# Patient Record
Sex: Male | Born: 1965 | Race: White | Hispanic: No | Smoking: Never smoker
Health system: Southern US, Community
[De-identification: ages and names within clinical notes are randomized; demographics above are authoritative.]

---

## 2003-09-10 ENCOUNTER — Inpatient Hospital Stay (HOSPITAL_COMMUNITY): Admission: RE | Admit: 2003-09-10 | Discharge: 2003-09-13 | Payer: Self-pay | Admitting: Psychiatry

## 2003-09-16 ENCOUNTER — Other Ambulatory Visit (HOSPITAL_COMMUNITY): Admission: RE | Admit: 2003-09-16 | Discharge: 2003-10-02 | Payer: Self-pay | Admitting: Psychiatry

## 2003-12-03 ENCOUNTER — Inpatient Hospital Stay (HOSPITAL_COMMUNITY): Admission: EM | Admit: 2003-12-03 | Discharge: 2003-12-05 | Payer: Self-pay | Admitting: Emergency Medicine

## 2004-06-05 ENCOUNTER — Ambulatory Visit (HOSPITAL_COMMUNITY): Admission: RE | Admit: 2004-06-05 | Discharge: 2004-06-06 | Payer: Self-pay | Admitting: Cardiology

## 2004-08-05 ENCOUNTER — Inpatient Hospital Stay (HOSPITAL_COMMUNITY): Admission: RE | Admit: 2004-08-05 | Discharge: 2004-08-09 | Payer: Self-pay | Admitting: Psychiatry

## 2004-08-05 ENCOUNTER — Ambulatory Visit: Payer: Self-pay | Admitting: Psychiatry

## 2004-08-05 ENCOUNTER — Emergency Department (HOSPITAL_COMMUNITY): Admission: EM | Admit: 2004-08-05 | Discharge: 2004-08-05 | Payer: Self-pay | Admitting: Emergency Medicine

## 2005-02-15 ENCOUNTER — Inpatient Hospital Stay (HOSPITAL_COMMUNITY): Admission: RE | Admit: 2005-02-15 | Discharge: 2005-02-17 | Payer: Self-pay | Admitting: Psychiatry

## 2005-02-15 ENCOUNTER — Emergency Department (HOSPITAL_COMMUNITY): Admission: EM | Admit: 2005-02-15 | Discharge: 2005-02-15 | Payer: Self-pay | Admitting: Emergency Medicine

## 2005-02-16 ENCOUNTER — Ambulatory Visit: Payer: Self-pay | Admitting: Psychiatry

## 2005-07-05 ENCOUNTER — Emergency Department (HOSPITAL_COMMUNITY): Admission: EM | Admit: 2005-07-05 | Discharge: 2005-07-06 | Payer: Self-pay | Admitting: Emergency Medicine

## 2005-07-06 ENCOUNTER — Inpatient Hospital Stay (HOSPITAL_COMMUNITY): Admission: EM | Admit: 2005-07-06 | Discharge: 2005-07-11 | Payer: Self-pay | Admitting: Psychiatry

## 2005-07-06 ENCOUNTER — Ambulatory Visit: Payer: Self-pay | Admitting: Psychiatry

## 2006-01-23 ENCOUNTER — Ambulatory Visit: Payer: Self-pay | Admitting: Psychiatry

## 2006-01-24 ENCOUNTER — Inpatient Hospital Stay (HOSPITAL_COMMUNITY): Admission: AD | Admit: 2006-01-24 | Discharge: 2006-01-26 | Payer: Self-pay | Admitting: Psychiatry

## 2007-04-09 ENCOUNTER — Other Ambulatory Visit: Payer: Self-pay

## 2007-04-09 ENCOUNTER — Inpatient Hospital Stay (HOSPITAL_COMMUNITY): Admission: AD | Admit: 2007-04-09 | Discharge: 2007-04-13 | Payer: Self-pay | Admitting: Psychiatry

## 2007-04-09 ENCOUNTER — Ambulatory Visit: Payer: Self-pay | Admitting: Psychiatry

## 2007-06-04 ENCOUNTER — Inpatient Hospital Stay (HOSPITAL_COMMUNITY): Admission: AD | Admit: 2007-06-04 | Discharge: 2007-06-06 | Payer: Self-pay | Admitting: *Deleted

## 2007-06-05 ENCOUNTER — Ambulatory Visit: Payer: Self-pay | Admitting: *Deleted

## 2007-06-28 ENCOUNTER — Emergency Department (HOSPITAL_COMMUNITY): Admission: EM | Admit: 2007-06-28 | Discharge: 2007-06-29 | Payer: Self-pay | Admitting: Emergency Medicine

## 2007-06-29 ENCOUNTER — Inpatient Hospital Stay (HOSPITAL_COMMUNITY): Admission: AD | Admit: 2007-06-29 | Discharge: 2007-07-05 | Payer: Self-pay | Admitting: *Deleted

## 2007-06-29 ENCOUNTER — Ambulatory Visit: Payer: Self-pay | Admitting: *Deleted

## 2007-09-11 ENCOUNTER — Emergency Department (HOSPITAL_COMMUNITY): Admission: EM | Admit: 2007-09-11 | Discharge: 2007-09-11 | Payer: Self-pay | Admitting: Emergency Medicine

## 2008-03-20 ENCOUNTER — Emergency Department (HOSPITAL_COMMUNITY): Admission: EM | Admit: 2008-03-20 | Discharge: 2008-03-20 | Payer: Self-pay | Admitting: Emergency Medicine

## 2008-06-19 ENCOUNTER — Emergency Department (HOSPITAL_COMMUNITY): Admission: EM | Admit: 2008-06-19 | Discharge: 2008-06-20 | Payer: Self-pay | Admitting: Emergency Medicine

## 2009-03-30 ENCOUNTER — Emergency Department (HOSPITAL_COMMUNITY): Admission: EM | Admit: 2009-03-30 | Discharge: 2009-03-31 | Payer: Self-pay | Admitting: Emergency Medicine

## 2009-03-30 ENCOUNTER — Ambulatory Visit: Payer: Self-pay | Admitting: Psychiatry

## 2009-03-31 ENCOUNTER — Inpatient Hospital Stay (HOSPITAL_COMMUNITY): Admission: RE | Admit: 2009-03-31 | Discharge: 2009-04-04 | Payer: Self-pay | Admitting: Psychiatry

## 2009-04-08 ENCOUNTER — Emergency Department (HOSPITAL_COMMUNITY): Admission: EM | Admit: 2009-04-08 | Discharge: 2009-04-09 | Payer: Self-pay | Admitting: Emergency Medicine

## 2009-04-13 ENCOUNTER — Emergency Department (HOSPITAL_COMMUNITY): Admission: EM | Admit: 2009-04-13 | Discharge: 2009-04-13 | Payer: Self-pay | Admitting: Family Medicine

## 2010-05-24 LAB — COMPREHENSIVE METABOLIC PANEL
ALT: 32 U/L (ref 0–53)
AST: 39 U/L — ABNORMAL HIGH (ref 0–37)
CO2: 26 mEq/L (ref 19–32)
Calcium: 8.8 mg/dL (ref 8.4–10.5)
Chloride: 104 mEq/L (ref 96–112)
Creatinine, Ser: 0.63 mg/dL (ref 0.4–1.5)
GFR calc Af Amer: >60 mL/min (ref >60)
GFR calc non Af Amer: >60 mL/min (ref >60)
Glucose, Bld: 102 mg/dL — ABNORMAL HIGH (ref 70–99)
Sodium: 140 mEq/L (ref 135–145)
Total Bilirubin: 0.5 mg/dL (ref 0.3–1.2)

## 2010-05-24 LAB — RAPID URINE DRUG SCREEN, HOSP PERFORMED
Amphetamines: NOT DETECTED
Benzodiazepines: NOT DETECTED
Cocaine: POSITIVE — AB
Tetrahydrocannabinol: POSITIVE — AB

## 2010-05-24 LAB — URINALYSIS, ROUTINE W REFLEX MICROSCOPIC
Bilirubin Urine: NEGATIVE
Hgb urine dipstick: NEGATIVE
Ketones, ur: NEGATIVE mg/dL
Nitrite: NEGATIVE
Protein, ur: 30 mg/dL — AB
Urobilinogen, UA: 0.2 mg/dL (ref 0.0–1.0)

## 2010-05-24 LAB — RPR: RPR Ser Ql: NONREACTIVE

## 2010-05-24 LAB — PROTIME-INR
INR: 1 (ref 0.00–1.49)
Prothrombin Time: 13.1 seconds (ref 11.6–15.2)

## 2010-05-24 LAB — URINE MICROSCOPIC-ADD ON

## 2010-05-24 LAB — DIFFERENTIAL
Basophils Absolute: 0 10*3/uL (ref 0.0–0.1)
Basophils Relative: 0 % (ref 0–1)
Eosinophils Absolute: 0.1 10*3/uL (ref 0.0–0.7)
Eosinophils Relative: 1 % (ref 0–5)
Neutrophils Relative %: 49 % (ref 43–77)

## 2010-05-24 LAB — CBC
Hemoglobin: 14.5 g/dL (ref 13.0–17.0)
MCHC: 33.5 g/dL (ref 30.0–36.0)
MCV: 88.1 fL (ref 78.0–100.0)
RBC: 4.92 MIL/uL (ref 4.22–5.81)
WBC: 4.6 10*3/uL (ref 4.0–10.5)

## 2010-05-27 LAB — DIFFERENTIAL
Basophils Relative: 2 % — ABNORMAL HIGH (ref 0–1)
Eosinophils Absolute: 0.1 10*3/uL (ref 0.0–0.7)
Eosinophils Relative: 1 % (ref 0–5)
Monocytes Absolute: 0.9 10*3/uL (ref 0.1–1.0)
Monocytes Relative: 7 % (ref 3–12)

## 2010-05-27 LAB — POCT I-STAT, CHEM 8
Calcium, Ion: 1.07 mmol/L — ABNORMAL LOW (ref 1.12–1.32)
Glucose, Bld: 95 mg/dL (ref 70–99)
HCT: 45 % (ref 39.0–52.0)
Hemoglobin: 15.3 g/dL (ref 13.0–17.0)
TCO2: 27 mmol/L (ref 0–100)

## 2010-05-27 LAB — CBC
HCT: 43.2 % (ref 39.0–52.0)
Platelets: 192 10*3/uL (ref 150–400)
RDW: 14.4 % (ref 11.5–15.5)

## 2010-05-27 LAB — ETHANOL: Alcohol, Ethyl (B): 303 mg/dL — ABNORMAL HIGH (ref 0–10)

## 2010-06-17 LAB — DIFFERENTIAL
Basophils Absolute: 0.1 10*3/uL (ref 0.0–0.1)
Basophils Relative: 1 % (ref 0–1)
Eosinophils Absolute: 0.3 10*3/uL (ref 0.0–0.7)
Eosinophils Relative: 3 % (ref 0–5)
Monocytes Absolute: 0.9 10*3/uL (ref 0.1–1.0)
Neutro Abs: 3.6 10*3/uL (ref 1.7–7.7)

## 2010-06-17 LAB — CBC
HCT: 45.1 % (ref 39.0–52.0)
Platelets: 213 10*3/uL (ref 150–400)
WBC: 8.4 10*3/uL (ref 4.0–10.5)

## 2010-06-17 LAB — COMPREHENSIVE METABOLIC PANEL
AST: 63 U/L — ABNORMAL HIGH (ref 0–37)
Albumin: 4.4 g/dL (ref 3.5–5.2)
Alkaline Phosphatase: 88 U/L (ref 39–117)
BUN: 6 mg/dL (ref 6–23)
Chloride: 109 mEq/L (ref 96–112)
Potassium: 4.4 mEq/L (ref 3.5–5.1)
Total Bilirubin: 0.2 mg/dL — ABNORMAL LOW (ref 0.3–1.2)

## 2010-06-17 LAB — LIPASE, BLOOD: Lipase: 32 U/L (ref 11–59)

## 2010-06-17 LAB — ETHANOL
Alcohol, Ethyl (B): 267 mg/dL — ABNORMAL HIGH (ref 0–10)
Alcohol, Ethyl (B): 437 mg/dL (ref 0–10)

## 2010-06-17 LAB — RAPID URINE DRUG SCREEN, HOSP PERFORMED: Benzodiazepines: NOT DETECTED

## 2010-06-22 LAB — LIPASE, BLOOD: Lipase: 22 U/L (ref 11–59)

## 2010-06-22 LAB — COMPREHENSIVE METABOLIC PANEL
ALT: 27 U/L (ref 0–53)
AST: 28 U/L (ref 0–37)
Albumin: 3.7 g/dL (ref 3.5–5.2)
CO2: 27 mEq/L (ref 19–32)
Calcium: 9.2 mg/dL (ref 8.4–10.5)
Chloride: 104 mEq/L (ref 96–112)
GFR calc Af Amer: >60 mL/min (ref >60)
GFR calc non Af Amer: >60 mL/min (ref >60)
Sodium: 138 mEq/L (ref 135–145)
Total Bilirubin: 1.4 mg/dL — ABNORMAL HIGH (ref 0.3–1.2)

## 2010-06-22 LAB — URINALYSIS, ROUTINE W REFLEX MICROSCOPIC
Nitrite: NEGATIVE
Specific Gravity, Urine: 1.022 (ref 1.005–1.030)
Urobilinogen, UA: 1 mg/dL (ref 0.0–1.0)

## 2010-06-22 LAB — DIFFERENTIAL
Eosinophils Absolute: 0.2 10*3/uL (ref 0.0–0.7)
Eosinophils Relative: 3 % (ref 0–5)
Lymphs Abs: 1.7 10*3/uL (ref 0.7–4.0)
Monocytes Absolute: 0.5 10*3/uL (ref 0.1–1.0)

## 2010-06-22 LAB — CBC
Platelets: 167 10*3/uL (ref 150–400)
RBC: 4.59 MIL/uL (ref 4.22–5.81)
WBC: 7 10*3/uL (ref 4.0–10.5)
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
## 2010-07-21 NOTE — H&P (Signed)
Maxwell Reeves, GREENLEY NO.:  [id number]   MEDICAL RECORD NO.:  [id number]          PATIENT TYPE:  IPS   LOCATION:  0506                          FACILITY:  [facility]   PHYSICIAN:  Anselm Jungling, MD  DATE OF BIRTH:  30-Oct-1965   DATE OF ADMISSION:  04/09/2007  DATE OF DISCHARGE:                       PSYCHIATRIC ADMISSION ASSESSMENT   This is a 45 year old male voluntarily admitted on April 09, 2007.   HISTORY OF PRESENT ILLNESS:  The patient presents with a history of  alcohol and drug abuse.  He was here approximately 1 year ago.  He has  been drinking about a fifth a day.  Also using cocaine, crack and  possibly marijuana.  He denies any suicidal thoughts.  He does feel  guilty about his relapsing.  The patient states that his stressors are  that he lost his job in December which contributed to the relapse.   PAST PSYCHIATRIC HISTORY:  The patient was here in November 2007 for  alcohol and an overdose on Klonopin.   SOCIAL HISTORY:  He is a 45 year old male, has a 87 year old child.  The  patient lives with his mother and his son also lives with his mother.  The patient is currently unemployed.   FAMILY HISTORY:  Is none known.   ALCOHOL DRUG HISTORY:  As above.  Denies any seizure activity.   MEDICAL HISTORY:  Primary care Merryn Thaker is Dr. Johnnye Lana in [place], [place].  Medical problems.  The patient has had two cardiac stents and  a history of hypertension.   MEDICATIONS:  Klonopin 1 mg t.i.d., Diovan 160/25 daily and trazodone  300 at bedtime.  Also recently received a prescription for Neurontin.  He states that was to be used to help him  taper off the Klonopin and  that was Neurontin 300 mg t.i.d.  Also an aspirin 325 mg daily.   DRUG ALLERGIES:  CODEINE and he states most ANTIDEPRESSANTS.   PHYSICAL EXAM:  The patient was assessed at [facility].  He  received Ultram 50 mg.  His temperature is 98.5, 88 heart rate, 20  respirations, blood pressure is 134/78.  He is 260 pounds.  He is 6 feet  4 and 1/2 inches tall.  He has noted tattoos to his shoulders.  He  appears very muscular and in no distress.  No tremors were noted.   LABORATORY DATA:  Alcohol level 243.  Urine drug screen is positive for  benzodiazepines and positive for cocaine.  His glucose is 112.   MENTAL STATUS EXAM:  He is alert and oriented, cooperative, currently in  the hospital gown, with good eye contact. His speech is clear, normal  pace and tone.  The patient's mood is guilty and anxious.  The patient  became tearful when talking about his son.  Thought processes are  coherent, goal directed. There is no evidence of any delusional thinking  or psychotic symptoms, cognitive function intact.  His memory is good.  Judgment and insight is fair.  Poor impulse control.   AXIS I:  Polysubstance abuse/dependence.  Substance-induced mood  disorder.  AXIS II:  Deferred.  AXIS III:  Coronary artery disease and hypertension.  AXIS IV:  Problems related to occupation, medical problems, possible  problems with economic issues and other psychosocial problems with  chronic substance use.  AXIS V:  Current is 40-45.   PLAN:  Contract for safety.  We will stabilize his mood and thinking.  Will detox the patient with Librium protocol, work on relapse  prevention.  The patient was notified we will discontinue his Klonopin.  The patient is agreeable to such.  We will continue to assess  comorbidities.  He is to attend the red group, and we will continue with  his other medications.  The patient to follow up with his primary care  Daisa Stennis as advised.   His tentative length of stay is 4-6 days.      Landry Corporal, N.P.      Anselm Jungling, MD  Electronically Signed    JO/MEDQ  D:  04/10/2007  T:  04/10/2007  Job:  [id number]

## 2010-07-24 NOTE — Cardiovascular Report (Signed)
NAMEWAYLAND, Maxwell Reeves NO.:  [id number]   MEDICAL RECORD NO.:  [id number]          PATIENT TYPE:  OIB   LOCATION:  6527                         FACILITY:  [facility]   PHYSICIAN:  Colleen Can. Deborah Chalk, M.D.DATE OF BIRTH:  [birth year]   DATE OF PROCEDURE:  06/05/2004  DATE OF DISCHARGE:  06/06/2004                              CARDIAC CATHETERIZATION   PROCEDURE:  Left heart catheterization with selective coronary  arteriography, left ventricular angiography, and PCI of the intermediate  restenosis within the Cypher stent.   TYPE AND SIGHT OF INTERVENTION:  Right femoral artery.   CATHETERS:  A 6 [place] _4 curve_________Judkins right and left coronary  catheters, a 6 French pigtail ventricular guiding catheter, 6 [place] JL4  guide, Prowater guide wire, 2.5 x 15 mm Quantum balloon, 3 x 15 mm cutting  balloon.   CONTRAST MATERIAL:  Omnipaque.   MEDICATIONS GIVEN PRIOR TO THE PROCEDURE:  Valium 10 mg p.o.   MEDICATIONS DURING THE PROCEDURE:  1.  Versed 3 mg IV.  2.  Angiomax.  3.  Intracoronary nitroglycerin.   COMMENTS:  The patient tolerated the procedure well.   HEMODYNAMIC DATA:  The aortic pressure was 107/62, LV was 108/1-11.  There  was no aortic valve gradient noted on pullback.   ANGIOGRAPHIC DATA:  1.  The right coronary artery had mild irregularities, it was a large      dominant vessel.  2.  Left main coronary artery is normal.  3.  Left anterior descending had irregularities without significant focal      stenosis.  4.  Left circumflex had irregularities without significant stenosis.  5.  The intermediate coronary artery had a severe stenosis within the Cypher      stent.  There was distal irregularities from the stent.  The side branch      which was at least a 2.5 mm vessel that arose within the stented segment      was widely patent and had satisfactory flow.  There was moderate (50 to      60%) narrowing prior to the stent.  There was residual  distal stenosis      present.   The left ventricular angiogram was performed in the RAO position.  Overall  coronary size was normal, global ejection fraction was 60%.   Angioplasty procedure we used a MV7 guide with Prowater guide wire.  This  passed across the lesion very efficiently.  We initially tried to cross with  the cutting balloon directly, but the stenosis was too tight.  We then  returned with a 2.5 x 15 mm Quantum Maverick balloon.  The lesion was pre-  dilated.  We then returned with the 3 x 15 mm cutting balloon and had  successful angioplasty with numerous inflations within the stented segment.  This resulted in an excellent flow.  The side branch remained patent.  There  was a 50% narrowing in the vessel distal to the stent that was present  previously, and we elected not to dilate further distally.   The patient tolerated the procedure well.  OVERALL IMPRESSION:  1.  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  2.  Successful cutting balloon angioplasty.      SNT/MEDQ  D:  06/05/2004  T:  06/06/2004  Job:  [id number]
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
## 2010-07-24 NOTE — Cardiovascular Report (Signed)
NAMEJDYN, PARKERSON NO.:  [id number]   MEDICAL RECORD NO.:  [id number]          PATIENT TYPE:  INP   LOCATION:  6526                         FACILITY:  [facility]   PHYSICIAN:  Colleen Can. Deborah Chalk, M.D.DATE OF BIRTH:  10-29-1965   DATE OF PROCEDURE:  DATE OF DISCHARGE:  12/05/2003                              CARDIAC CATHETERIZATION   HISTORY:  Mr. Mori is a 45 year old gentleman who presents with substernal  chest pain and had a small rise in his cardiac troponins.  He was referred  for catheterization.   PROCEDURE:  Left heart catheterization with selective coronary angiography,  left ventricular angiography with percutaneous coronary intervention of a  complex lesion in a large bifurcating intermediate vessel, including the  point of bifurcation with stenting across the bifurcation with angioplasty  of the ostium of the other large branch.   TYPE AND SITE OF ENTRY:  Percutaneous right femoral artery.   CATHETERS:  1.  6 French __________ Judkins right and left coronary catheters.  2.  6 French pigtail ventriculography catheter.  3.  7 [place] JL4 guide.  4.  High-torque floppy guide wires x 2 (dual wires).  5.  2.5 x 15 Maverick balloon.  6.  A 3.0 x 23 mm CYPHER stent.   CONTRAST MATERIAL:  Omnipaque 275 mL.   MEDICATIONS GIVEN PRIOR TO PROCEDURE:  Valium 10 mg p.o.   MEDICATIONS GIVEN DURING PROCEDURE:  1.  Versed 3 mg IV.  2.  Integrillin.  3.  Heparin 6100 units.  4.  Plavix 600 mg.   COMMENTS:  The patient tolerated the procedure well.   HEMODYNAMIC DATA:  The aortic pressure was 119/79.  LV was 113/9-60 and  there was no aortic valve gradient noted on pullback.   ANGIOGRAPHIC DATA:  1.  The left main coronary artery is normal.  2.  The left circumflex is moderate size.  There were mild irregularities in      the body of the left circumflex.  3.  The left anterior descending is a large vessel.  There was 20-30%      narrowing after the first  septal perforating branch.  4.  The right coronary artery is a moderately large dominant vessel.  There      was 50-60% narrowing after the acute margin, but before the crux.      Distal vessels were satisfactory.  There were some scattered      irregularities proximally.  5.  The intermediate coronary was a large vessel that bifurcated.  One      branch of the bifurcation had a 95% stenosis that was approximately 10      mm in length.  It was just after the bifurcation.  The plaque seemingly      involved the ostium of the second branch.  Functionally it was a      bifurcation lesion.   PERCUTANEOUS INTERVENTION:  Using the 7 [place] system, we placed dual guide  wires into both branches of the intermediate.  We initially dilated the more  severely stenotic branch with 2.5  x 15 mm Maverick balloon.  We then  returned with the second Maverick balloon, not with kissing balloon  technique, but by an individual balloon that dilated the ostium of the other  branch.  We then placed the 23 mm x 3.0 mm CYPHER stent in the branch that  was more severely stenotic.  We then removed the guide wire from the less  severely stenotic branch.  The CYPHER stent was inflated to a maximum of 17  atmospheres.  The final angiographic result shows no residual stenosis in  the branch that received the stent.  In the other branch that arose through  the stent, there appeared to be 50% to perhaps 60% narrowing.  There  certainly was no compromise of flow in either branch and TIMI-3 grade flow  was present.  Overall the patient tolerated the procedure well.   The left ventricular angiogram was performed, which showed very minimal  anterolateral hypokinesis.  The global ejection fraction would be 55%.  There was no mitral regurgitation.   OVERALL IMPRESSION:  1.  Mild anterolateral hypokinesia with a global ejection fraction of 55%.  2.  Severe stenosis in a bifurcation lesion of a large intermediate vessel       with successful stent placement and balloon angioplasty of the ostium of      the second vessel.  3.  Mild atherosclerosis of the left anterior descending with moderate      atherosclerosis in the right coronary artery.       SNT/MEDQ  D:  12/04/2003  T:  12/05/2003  Job:  [id number]

## 2010-07-24 NOTE — Discharge Summary (Signed)
Maxwell Reeves, Maxwell Reeves NO.:  [id number]   MEDICAL RECORD NO.:  [id number]          PATIENT TYPE:  OIB   LOCATION:  6527                         FACILITY:  [facility]   PHYSICIAN:  Colleen Can. Deborah Chalk, M.D.DATE OF BIRTH:  [birth year]   DATE OF ADMISSION:  06/05/2004  DATE OF DISCHARGE:  06/06/2004                                 DISCHARGE SUMMARY   DATE OF DISCHARGE:  Tentatively set for June 06, 2004.   PRIMARY DISCHARGE DIAGNOSES:  Recurrent chest pain with subsequent elective  cardiac catheterization with percutaneous coronary intervention to  intermediate vessel secondary to re-stenosis within the previously placed  Cypher stent.   SECONDARY DISCHARGE DIAGNOSES:  1.  Known history of atherosclerotic cardiovascular disease with previous      percutaneous coronary intervention performed in September of 2005.  2.  Hypertension.  3.  Anxiety and depression.  4.  Hyperlipidemia.  5.  Ongoing tobacco abuse.   HISTORY OF PRESENT ILLNESS:  The patient is a 45 year old white male who has  multiple medical problems.  He presented to the office as a work-in  appointment earlier this week with complaints of recurrent angina. His chest  pain syndrome consists of his arms getting quite heavy and lifeless then  with the onset of chest heaviness that has been exertional in nature.  He  has previously been off his Norvasc over the past few days and doubled up on  his Diovan for better blood pressure control.  He stopped his Plavix at  least six weeks ago after an episode of the flu.  Unfortunately he continues  to smoke.   Please see dictated history and physical for further patient presentation  and profile.   LABORATORY DATA:  A 12 lead electrocardiogram shows normal sinus rhythm.  There are no acute changes.  Pro Time and PTT were unremarkable.   Chemistries were satisfactory.  Cholesterol level showed total cholesterol  157, triglycerides 92, HDL 59, LDL 80.  CBC is  normal.   HOSPITAL COURSE:  The patient was admitted electively.  He underwent repeat  catheterization per Dr. Roger Shelter.  That procedure was tolerated well  without any known complications.  The left main was normal.  The right  coronary artery had irregularities as well as the left anterior descending  and left circumflex.  There was a large intermediate vessel that has a 99%  narrowing within the previously placed stent from September of 2005.  Subsequent cutting balloon angioplasty was performed with a 3.0 X 15 mm  cutting balloon with an excellent result obtained.  There was a residual 50%  narrowing after the stent that was not dilated.  Post procedure he was  transferred to 6500 and plans will be made for him to be discharged in the  morning if deemed stable on A.M. rounds.   CONDITION ON DISCHARGE:  Stable.   DISCHARGE MEDICATIONS:  He will resume all of his previous home medications.  We will need for him to continue Plavix 75 mg each day, aspirin daily and he  will resume his Lipitor and  Diovan as he was taking before.   ACTIVITY:  His activity is to be light over the next few days.   He is strongly encouraged to not smoke.   FOLLOW UP:  We will plan to see him back in the office next week.  He is  asked to call to schedule that appointment, certainly sooner if problems  arise.      LC/MEDQ  D:  06/05/2004  T:  06/06/2004  Job:  [id number]
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
## 2010-07-24 NOTE — H&P (Signed)
NAME:  Maxwell Reeves, Maxwell Reeves NO.:  [id number]   MEDICAL RECORD NO.:  [id number]          PATIENT TYPE:  IPS   LOCATION:  0304                          [facility]  [facility]   PHYSICIAN:  Geoffery Lyons, M.D.      DATE OF BIRTH:  [birth year]   DATE OF ADMISSION:  08/05/2004  DATE OF DISCHARGE:                         PSYCHIATRIC ADMISSION ASSESSMENT   IDENTIFYING INFORMATION:  The patient is a 45 year old divorced white male  who was voluntarily admitted to the [facility] on Aug 05, 2004.   HISTORY OF PRESENT ILLNESS:  The patient presented to [facility] Emergency  Department requesting detox after a three-day blackout from drinking liquor,  smoking marijuana and cocaine use.  The patient admits to being a periodic  drinker who has detoxed one year ago here at the [facility]  and subsequently attended the [facility] program in the outpatient department.  He began drinking after 40 days of sobriety.  He also reports that he wants  to get off of Klonopin for which he has been taking for several years.  He  says that his drinking increases with stress and recent stressors of having  to increase his child support payments have increased his drinking.  He  denies any suicidal or homicidal ideation.  He denies any auditory or visual  hallucinations or any mania.   PAST PSYCHIATRIC HISTORY:  He was inpatient at the [facility]  in July of 2005.  He was outpatient at [facility] at the [facility] later that same month.   SOCIAL HISTORY:  He lives part-time with his girlfriend, part-time with his  mother.  He works for a [employer].  He has two sons, 29 and 11 years  of age, who he will have all this summer.   FAMILY HISTORY:  His father and his paternal grandfather were both  alcoholic.  His sister has depression.   ALCOHOL/DRUG HISTORY:  He is a weekend drinker for the most part.  He drinks  a fifth of liquor a day plus a fifth  of wine.  He smokes marijuana and does  cocaine occasionally.   MEDICAL PROBLEMS:  Coronary artery disease.  He is status post MI in  September of 2005 and status post cardiac stent placement.  He also has  angina and hypertension as well as irritable bowel syndrome.   ALLERGIES:  He is allergic to CODEINE.   PHYSICAL EXAMINATION:  Performed at [facility] Emergency Department.  Here  at the [facility], his vital signs were temperature 98.2,  pulse 54, respirations 20, blood pressure 133/84.   LABORATORY DATA:  His CBC was within normal limits.  His blood chemistries  were within normal limits except for a glucose reading of 132.  His liver  function tests were within normal limits except for creatinine of 1.3.  His  TSH was 3.081.  His urine drug screen was positive for cocaine, marijuana  and his alcohol blood level was 208.   MENTAL STATUS EXAM:  He was alert and oriented  x 4.  He had good eye contact  with a worried affect.  His appearance was casual.  His behavior was calm  and cooperative.  His speech was clear with an even pace and tone.  His mood  was depressed as he was tearful at times.  He also appeared anxious.  His  thought process was coherent with no suicidal or homicidal ideation.  No  signs of psychosis or mania were observed.  His cognitive function with  concentration normal.  His memory was intact.  His insight was poor.  His  impulse control is fair.   DIAGNOSES:   AXIS I:  1.  Depressive disorder not otherwise specified.  2.  Alcohol abuse; rule out dependence.   AXIS II:  Deferred.   AXIS III:  1.  Coronary artery disease.  2.  Status post myocardial infarction.  3.  Status post cardiac stent.  4.  Angina.  5.  Hypertension.  6.  Irritable bowel syndrome.   AXIS IV:  Mild to moderate (medical problems).   AXIS V:  Current Global Assessment of Functioning 38; past year 65.   INITIAL PLAN:  To admit the patient voluntarily.  He is  placed on a Librium  detox protocol.  We will start an SSRI.  We will also put him on trazodone  for sleep and before discharge we will start him on Campral for alcohol  cravings.  We will work to increase his coping skills and decrease his  stressors.  We will have a family session with his mother and girlfriend.  We will encourage follow-up with AA and NA and he will need a referral for  psychiatric follow-up.   TENTATIVE LENGTH OF STAY:  Four to seven days.       AHW/MEDQ  D:  08/06/2004  T:  08/06/2004  Job:  [id number]
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
## 2010-07-24 NOTE — Discharge Summary (Signed)
NAME:  Maxwell Reeves, GRAVE NO.:  [id number]   MEDICAL RECORD NO.:  [id number]          PATIENT TYPE:  IPS   LOCATION:  0304                          [facility]ACILITY[facility]  [facility]   PHYSICIAN:  Geoffery Lyons, M.D.      DATE OF BIRTH:  1965-04-04   DATE OF ADMISSION:  08/05/2004  DATE OF DISCHARGE:  08/09/2004                                 DISCHARGE SUMMARY   CHIEF COMPLAINT AND PRESENT ILLNESS:  This was the second admission to [facility] for this 45 year old divorced white male voluntarily  admitted.  Presented to the [facility] Emergency Department requesting  detox after three-day blackout from drinking liquor, smoking marijuana and  cocaine.  Endorsed being periodic drinker who has detoxed one year ago at  [facility].  He went to [facility]  afterwards.  Began drinking after 40  days of sobriety.  He also reported wanting to get off his Klonopin which he  has been taking for several years.  Recent stressors of having to increase  his child support payments.   PAST PSYCHIATRIC HISTORY:  Second time at [facility].  First was in  July of 2005.  Also active in the CD IOP program at [facility].   ALCOHOL/DRUG HISTORY:  Claimed he is a weekend drinker for the most part.  He drinks a fifth of liquor a day plus a fifth of wine.  Smokes marijuana  and does cocaine occasionally.   MEDICAL HISTORY:  Coronary artery disease, status post MI in September of  2005, status post cardiac stent placement, angina, hypertension, irritable  bowel syndrome.   PHYSICAL EXAMINATION:  Performed and failed to show any acute findings.   LABORATORY DATA:  CBC within normal limits.  Blood chemistry within normal  limits.  Glucose 132.  Liver function tests within normal limits.  TSH  3.081.  Drug screen positive for cocaine and marijuana.  Alcohol level was  208.   MENTAL STATUS EXAM:  Alert, cooperative male with good eye contact.  Worried.  Casual appearance.  He  was calm, cooperative.  Speech was clear,  even pace and tone.  Mood was depressed.  He was tearful at times.  Also  appeared anxious.  Thought processes were logical, coherent and relevant.  No delusions.  No suicidal or homicidal ideation.  No hallucinations.  Cognition was well-preserved.   ADMISSION DIAGNOSES:   AXIS I:  1.  Depressive disorder not otherwise specified.  2.  Alcohol.  3.  Polysubstance abuse.   AXIS II:  No diagnosis.   AXIS III:  1.  Coronary artery disease.  2.  Status post myocardial infarction.  3.  Status post cardiac stent.  4.  Angina.  5.  Hypertension.  6.  Irritable bowel syndrome.   AXIS IV:  Moderate.   AXIS V:  Global Assessment of Functioning upon admission 38; highest Global  Assessment of Functioning in the last year 65.   HOSPITAL COURSE:  He was admitted and he was started in individual and group  psychotherapy.  He was given  Ambien for sleep.  He was detoxified with  Librium.  He was given Plavix 75 mg in the morning, Diovan 160/25 mg in the  morning, Lipitor 10 mg in the morning, NTG 0.4 at onset of chest pain,  Protonix 40 mg per day, trazodone 50 mg at night.  He was started on Zoloft  50 mg per day and he was also started on Campral 2 three times a day.  Eventually, Zoloft was discontinued.  He was able to settle down and open  up.  Endorsed conflictive relationship with the girlfriend.  Says that he  had decided not to pursue the relationship anymore.  Endorsed he had two  MIs.  Got very depressed about this.  Endorsed that he wanted to see his  children grow up.  Committed this time around to stay abstinent.  Does  understand the effect of alcohol on his cardiovascular function.  [place]iel he  was wanting to go to [employer].  Resistant to any psychotropic medications.  Had a  bad response to Zoloft in the past with some anxiety.  Would like to come  off that Zoloft.  Also aware of how he was depending on the Klonopin and was  wanting to not  pursue it further once he was detoxed.  He did get involved  in the individual and group process.  He worked on Pharmacologist, on relapse  prevention.  On June 4th, he was in full contact with reality.  There were  no suicidal ideation, no homicidal ideation, no hallucinations, no  delusions.  He was willing and motivated to pursue further outpatient  treatment.   DISCHARGE DIAGNOSES:   AXIS I:  1.  Alcohol dependence.  2.  Polysubstance abuse.  3.  Depressive disorder not otherwise specified.   AXIS II:  No diagnosis.   AXIS III:  1.  Status post myocardial infarction.  2.  Coronary artery disease.  3.  Status post cardiac stent placement in January.  4.  Arterial hypertension.  5.  Irritable bowel syndrome.   AXIS IV:  Moderate.   AXIS V:  Global Assessment of Functioning upon discharge 50-55.   DISCHARGE MEDICATIONS:  1.  Plavix 75 mg per day.  2.  Diovan 160/25 mg per day.  3.  Lipitor 10 mg per day.  4.  Campral 333 mg, 2 three times a day.  5.  Trazodone 100 mg at bedtime.   FOLLOW UP:  [facility].       IL/MEDQ  D:  09/07/2004  T:  09/07/2004  Job:  [id number]

## 2010-07-24 NOTE — Discharge Summary (Signed)
NAME:  Maxwell Reeves, Maxwell Reeves NO.:  [id number]   MEDICAL RECORD NO.:  [id number]          PATIENT TYPE:  IPS   LOCATION:  0508                          FACILITY[facility]  [facility]   PHYSICIAN:  Geoffery Lyons, M.D.      DATE OF BIRTH:  1966-01-29   DATE OF ADMISSION:  01/24/2006  DATE OF DISCHARGE:  01/26/2006                               DISCHARGE SUMMARY   CHIEF COMPLAINT AND PRESENT ILLNESS:  This was one of multiple  admissions to [facility] for this 45 year old single  male.  Relapsed on alcohol.  Has been on five-day drinking binge.  Wrote  an apologetic note which family interpreted as a suicide note.  He  denied suicide attempt.  Endorsed impulsively taking Klonopin tablets,  several, but not the quantity that was indicated in the petition.  Endorsed depression over multiple losses.   PAST PSYCHIATRIC HISTORY:  Multiple admissions to [facility].  Last time in May of 2007 mostly for alcohol detox.   ALCOHOL/DRUG HISTORY:  As already stated, persistent use of alcohol as  well as history of cocaine and marijuana use.   MEDICAL HISTORY:  Irritable bowel syndrome, coronary artery disease with  a stent placement, arterial hypertension, angina.   MEDICATIONS:  Trazodone 150 mg at night, aspirin 1 daily, Diovan 160 mg,  hydrochlorothiazide 12.5 mg.  Not compliant with medication.   PHYSICAL EXAMINATION:  Performed and failed to show any acute findings.   LABORATORY DATA:  TSH 0.763.  EKG normal.   MENTAL STATUS EXAM:  Fully alert, pleasant, cooperative male.  Endorsed  anxiety.  Upset with disrupted sleep, nightmares.  Speech normal rate,  tempo and production.  Mood anxious.  Affect anxious, depressed, and  lability.  Thought processes logical, coherent and relevant.  No  delusions.  No active suicidal ideation.  No homicidal ideation.  No  hallucinations.  Cognition was well-preserved.   ADMISSION DIAGNOSES:  AXIS I:  Alcohol dependence.   Cocaine, marijuana,  benzodiazepine abuse.  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  AXIS II:  No diagnosis.  AXIS III:  Coronary artery disease, arterial hypertension, status post  stent placement, status post benzodiazepine overdose.  AXIS IV:  Moderate.  AXIS V:  GAF upon admission 47; highest GAF in the last year 65.   HOSPITAL COURSE:  He was admitted.  He was started in individual and  group psychotherapy.  He has been going to [facility] for the last six months.  He was staying with the mother.  Took the pills after he got into an  argument with his mother.  Sister, he claims, is bipolar.  She is also  in the house.  Lost his job.  Still collecting unemployment.  Has not  been back with his girlfriend.  Endorsed nightmares, fears, endorsed he  did not do well on antidepressants.  After last detox, he was abstinent  for several months.  He has requested Seroquel that he claimed was  effective for him in the past.  He was detoxified with  Librium.  He was  maintained on his other medications.  Seroquel was placed up to 100 mg  and he was given trazodone.  With the Seroquel, he started sleeping  better.  There was a lot of uncertainty and concerns about what was  going to happen once he left the hospital.  His mother wanted him to go  into a long-term residential program but he was not interested in doing  so.  He felt that he had a good setting with [facility].  Through [facility], they  were going to help him with a job as long as he maintained sobriety.  We  saw a lot of reasons why not to go into rehab at this particular time  and continued to work in an outpatient setting.  By January 26, 2006,  he was fully detoxed, in full contact with reality.  No suicidal or  homicidal ideation.  No hallucinations or delusions.  He was going to  pursue the medications further.  [facility] was going to work with him and he  was looking forward to this.   DISCHARGE  DIAGNOSES:  AXIS I:  Alcohol dependence.  Cocaine, marijuana  and benzodiazepine abuse.  Mood disorder not otherwise specified.  AXIS II:  No diagnosis.  AXIS III:  Coronary artery disease, arterial hypertension, status post  stent placement, status post benzodiazepine overdose.  AXIS IV:  Moderate.  AXIS V:  GAF upon discharge 50-55.   DISCHARGE MEDICATIONS:  1. Diovan 160/12.5 mg per day.  2. Aspirin 325 mg daily.  3. Finish Librium detox, 25 mg at 6 p.m. on January 26, 2006, then      Librium 25 mg, 1 at 9 a.m. on January 27, 2006, then discontinue.  4. Trazodone 150 mg at bedtime.  5. Seroquel 100 mg at bedtime.  6. Bactroban 2% ointment, apply to lesion.  7. Levsin 0.125 mg sublingual every four hours as needed.  8. Nitrostat 0.4 mg, use as indicated for headaches.   FOLLOWUP:  [facility] and [facility].      Geoffery Lyons, M.D.  Electronically Signed     IL/MEDQ  D:  02/15/2006  T:  02/15/2006  Job:  [id number]

## 2010-07-24 NOTE — H&P (Signed)
NAMETOMI, PADDOCK NO.:  [id number]   MEDICAL RECORD NO.:  [id number]           PATIENT TYPE:   LOCATION:                               FACILITY:  [facility]   PHYSICIAN:  Colleen Can. Deborah Chalk, M.D.DATE OF BIRTH:  01-29-1966   DATE OF ADMISSION:  06/05/2004  DATE OF DISCHARGE:                                HISTORY & PHYSICAL   CHIEF COMPLAINT:  Recurrent chest pain.   HISTORY OF PRESENT ILLNESS:  The patient is a 45 year old white male who has  multiple cardiovascular risk factors.  He has a known history of ischemic  heart disease and had previous stent placement to the intermediate coronary  artery and a large bifurcating segment in September 2005.  He presented to  the office for a follow-up appointment on March 28.  At that time, he  reported that over the past 3 weeks at least he has had recurrent episodes  of chest discomfort.  He describes it in which his arms get quite heavy and  feel lifeless and then he begins to have the onset of chest heaviness that  has been exertional in nature.  He has not used nitroglycerin.  He has been  off of his Norvasc over the past few days and has actually doubled up on his  Diovan dose for better blood pressure control.  He has had a recent bout of  the flu approximately  weeks ago and following that he stopped his Plavix.  He is now referred for elective repeat cardiac catheterization.   PAST MEDICAL HISTORY:  1.  Known ischemic heart disease with previous catheterization dating back      to September 2005.  At that time, he had stent placement to the      intermediate coronary and large bifurcating segment with a 3.0 x 23 mm      cipher stent.  Other findings at that time showed the left main to be      normal, the left circumflex was of moderate size with mild      irregularities, there was a 20 to 30% narrowing after the first septal      perforating branch, and 50 to 60% narrowing after the acute margin of      the  right coronary.  His global ejection fraction was 55%.  2.  Hypertension.  3.  Anxiety.  4.  History of depression.  5.  Hyperlipidemia.  6.  Obesity.  7.  Ongoing tobacco abuse.   ALLERGIES:  CODEINE.   CURRENT MEDICATIONS:  1.  Lipitor 10 mg a day.  2.  Aspirin daily.  3.  Diovan HCT 160/25.   FAMILY HISTORY:  His brother died at age 62 with a heart attack, his father  died at age 32 with a heart attack as well.  His mother is alive in her  early 76s and has hypertension and diabetes.  He has one sister who is older  than he by just a couple of years who apparently has had a history of three  heart attacks.  SOCIAL HISTORY:  He is single, he currently lives with his mother, he is  divorced, he works as a Counsellor.  He has smoked over the past 20 years and  in the past he has also had heavy alcohol use but now is only drinking one  to two beers per day.  In the past, he has had cocaine use but no illicit  drug use at the present time.   REVIEW OF SYSTEMS:  He has had no recent fever or flu but did have a bout of  the flue approximately 6 to 8 weeks ago.  He has had no real episodes of  shortness of breath and his chest pain is as described above.  He has had no  abdominal pain, constipation or diarrhea.   PHYSICAL EXAMINATION:  VITAL SIGNS:  His weight is 264 pounds.  Blood  pressure is 130/96 sitting and 140/86 standing.  Heart rate 76, respirations  18, he is afebrile.  SKIN:  Warm and dry, color is unremarkable.  LUNGS:  Basically clear.  CARDIAC:  Regular rhythm.  EXTREMITIES:  Without edema, he is heavily tattooed.   PERTINENT LAB VALUES:  Pending.   12 lead electrocardiogram shows normal sinus rhythm and there are no acute  changes.   OVERALL IMPRESSION:  1.  Recurrent episodes of chest pain consistent with his previous chest pain      syndrome.  2.  Known ischemic heart disease with previous stent placement 6 months ago.  3.  Hypertension.  4.  Anxiety and  depression.  5.  Hyperlipidemia.  6.  Obesity.  7.  Tobacco abuse.   PLAN:  We will proceed on with a repeat catheterization.  The patient is  started back on Plavix at 75 mg a day, samples of that were provided.  The  procedure was reviewed in full detail and he is willing to proceed on June 05, 2004.      LC/MEDQ  D:  06/03/2004  T:  06/03/2004  Job:  [id number]
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
## 2010-07-24 NOTE — Discharge Summary (Signed)
NAME:  Maxwell Reeves, Maxwell Reeves NO.:  [id number]   MEDICAL RECORD NO.:  [id number]          PATIENT TYPE:  IPS   LOCATION:  0506                          [facility]ACILITY:  [facility]   PHYSICIAN:  Anselm Jungling, MD  DATE OF BIRTH:  November 01, 1965   DATE OF ADMISSION:  04/09/2007  DATE OF DISCHARGE:  04/13/2007                               DISCHARGE SUMMARY   IDENTIFYING DATA AND REASON FOR ADMISSION:  This was an inpatient  psychiatric admission for [name], a 45 year old single white male admitted  for help with alcohol and drug abuse.  His last admission here had been  2 years prior.  Please refer to the admission note for further details  pertaining to the symptoms, circumstances and history that led to his  hospitalization.  He was given initial Axis I diagnoses of polysubstance  abuse/dependence and substance induced mood disorder.   MEDICAL AND LABORATORY:  The patient was medically and physically  assessed by the psychiatric nurse practitioner.  He was in good health  without any active or chronic medical problems.  He did have some  hypertension, which was treated with Avapro and hydrochlorothiazide as a  substitution for his usual Diovan.  He was also continued on Neurontin  300 mg t.i.d. and aspirin 325 mg daily.   HOSPITAL COURSE:  The patient was admitted to the adult inpatient  psychiatric service.  He presented as a well-nourished, well-developed  male who was pleasant, alert, fully oriented but very sad.  He openly  discussed his substance abuse and alcohol abuse problems and need for  continued sobriety.  He denied suicidal ideation and verbalized a strong  desire for help.   He was involved in the therapeutic milieu, including groups and  activities geared towards 12-step recovery.  He was a very good  participant.   He had also been taking Klonopin 3 mg at bedtime prior to admission and  he was detoxified from this and alcohol use with a Librium tapering   protocol.   The patient had positive contacts with both his mother and 32 year old  son during his stay.  He made solid plans for a recovery program  involving 12-step, AA involvement and also involvement with 2 different  churches that he has been a part of.   At the time of discharge, his overall mood and outlook was much more  positive.  He had minimal withdrawal symptoms during his inpatient stay  and none at the time of discharge.  He agreed to the following aftercare  plan:   AFTERCARE:  The patient was to follow up at the [facility] with a walkin appointment following discharge.  He was to attend  AA groups as described above.   DISCHARGE MEDICATIONS:  1. Diovan 160/25 daily.  2. Neurontin 300 mg t.i.d.  3. Aspirin 325 mg daily.   DISCHARGE DIAGNOSES:  AXIS I:  Polysubstance dependence, early  remission.  AXIS II:  Deferred.  AXIS III:  Chronic pain, hypertension.  AXIS IV:  Stressors severe.  AXIS V:  GAF on  discharge 65.      Anselm Jungling, MD  Electronically Signed     SPB/MEDQ  D:  04/14/2007  T:  04/15/2007  Job:  [id number]
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
## 2010-07-24 NOTE — Discharge Summary (Signed)
NAMESHEPPARD, LUCKENBACH NO.:  [id number]   MEDICAL RECORD NO.:  [id number]          PATIENT TYPE:  INP   LOCATION:  6526                         FACILITY:  [facility]   PHYSICIAN:  Colleen Can. Deborah Chalk, M.D.DATE OF BIRTH:  25-Sep-1965   DATE OF ADMISSION:  12/03/2003  DATE OF DISCHARGE:  12/05/2003                                 DISCHARGE SUMMARY   PRIMARY DISCHARGE DIAGNOSES:  Chest pain with subsequent elective cardiac  catheterization with stent placement to the intermediate coronary artery and  a large bifurcating segment with a subsequent 3.0x23 mm Cypher stent  placement and subsequent dual wires with a 2.5x15 mm Maverick balloon  angioplasty.  The other findings include normal left main, 20-30% after the  first septal perforating branch of the LAD, mild irregularities of the  circumflex, and 50-60% narrowing just before the crux of the right coronary.  There was very mild anterolateral hypokinesis on the LV gram.   SECONDARY DISCHARGE DIAGNOSES:  1.  Hypertension on Norvasc.  2.  Anxiety and depression.   HISTORY OF PRESENT ILLNESS:  The patient is a very pleasant 45 year old male  who has history over the last six weeks of atypical chest pain.  It  initially started with walking in which he notes that his arms felt heavy,  somewhat numb and tingling and then chest heaviness.  He was brought to the  emergency room and subsequently admitted for further evaluation.  He did  have a mild rise in troponin levels.   Please see dictated History and Physical for further patient presentation  and profile.   LABORATORY DATA:  Chemistries were normal.  CK's were negative x2.  Troponin  was 0.10 to 0.16.  CBC was normal.  EKG showed no acute changes.   HOSPITAL COURSE:  The patient was admitted.  We proceeded on with cardiac  catheterization on December 04, 2003.  That procedure was tolerated well  without any known complications.  The findings are as noted above.   The  intermediate demonstrated a 95% narrowing and a large bifurcation.  Subsequent balloon angioplasty was performed to a side branch with a 3.0x23  mm Cypher stent placed to the intermediate itself.  The procedure was  tolerated well.  Afterwards, he was transferred to 6500, and today on  December 05, 2003, he is doing well without complaints.  His groin has  remained unremarkable.  Postprocedural labs are satisfactory, and he is felt  to be a stable candidate for discharge today.   CONDITION ON DISCHARGE:  Stable.   DISCHARGE MEDICATIONS:  1.  Plavix 75 mg daily.  2.  Lipitor 10 mg daily.  3.  Aspirin daily.  4.  He will continue with Norvasc as he was taking before.  5.  Prescription for nitroglycerin was provided as well.   DISCHARGE INSTRUCTIONS:  1.  Activity:  Light.  2.  We will see him back in the office in approximately 2 weeks.  He is      asked to call to schedule that appointment.  Certainly sooner if  problems arise in the interim.       LC/MEDQ  D:  12/05/2003  T:  12/05/2003  Job:  [id number]   cc:   [facility]
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
## 2010-11-27 LAB — RAPID URINE DRUG SCREEN, HOSP PERFORMED
Amphetamines: NOT DETECTED
Opiates: NOT DETECTED
Tetrahydrocannabinol: NOT DETECTED

## 2010-11-27 LAB — BASIC METABOLIC PANEL WITH GFR
BUN: 8
CO2: 26
Calcium: 9
Chloride: 102
Creatinine, Ser: 0.75
GFR calc non Af Amer: >60
Glucose, Bld: 112 — ABNORMAL HIGH
Potassium: 4.3
Sodium: 141

## 2010-11-27 LAB — HEPATIC FUNCTION PANEL
ALT: 42
AST: 50 — ABNORMAL HIGH
Albumin: 4.1
Alkaline Phosphatase: 73
Bilirubin, Direct: 0.1
Indirect Bilirubin: 0.6
Total Bilirubin: 0.7
Total Protein: 7

## 2010-11-27 LAB — ETHANOL

## 2010-12-01 LAB — COMPREHENSIVE METABOLIC PANEL
BUN: 10
Calcium: 8.6
Creatinine, Ser: 0.8
Glucose, Bld: 103 — ABNORMAL HIGH
Total Protein: 6.4

## 2010-12-01 LAB — CBC
HCT: 41
Hemoglobin: 14.4
MCHC: 35.3
MCV: 88.4
RDW: 12.1

## 2010-12-01 LAB — URINALYSIS, ROUTINE W REFLEX MICROSCOPIC
Nitrite: NEGATIVE
Specific Gravity, Urine: 1.008
Urobilinogen, UA: 0.2
pH: 6

## 2010-12-01 LAB — DIFFERENTIAL
Lymphs Abs: 2.7
Monocytes Relative: 11
Neutro Abs: 3.9
Neutrophils Relative %: 50

## 2010-12-01 LAB — ETHANOL: Alcohol, Ethyl (B): 288 — ABNORMAL HIGH

## 2010-12-01 LAB — SALICYLATE LEVEL: Salicylate Lvl: <4

## 2010-12-01 LAB — RAPID URINE DRUG SCREEN, HOSP PERFORMED
Cocaine: POSITIVE — AB
Opiates: NOT DETECTED
Tetrahydrocannabinol: NOT DETECTED

## 2010-12-03 LAB — BASIC METABOLIC PANEL
CO2: 26
Calcium: 8.7
Chloride: 104
Creatinine, Ser: 0.75
Glucose, Bld: 122 — ABNORMAL HIGH
Sodium: 141

## 2010-12-03 LAB — DIFFERENTIAL
Basophils Absolute: 0
Basophils Relative: 1
Eosinophils Absolute: 0.2
Eosinophils Relative: 2
Monocytes Absolute: 0.8
Monocytes Relative: 10
Neutro Abs: 3.2

## 2010-12-03 LAB — CBC
Hemoglobin: 14.9
MCHC: 34.2
MCV: 88.7
RDW: 14.2

## 2010-12-03 LAB — RAPID URINE DRUG SCREEN, HOSP PERFORMED
Amphetamines: NOT DETECTED
Barbiturates: NOT DETECTED
Benzodiazepines: NOT DETECTED
Opiates: NOT DETECTED

## 2011-02-02 IMAGING — CT CT HEAD W/O CM
5 of 8 series · 16 of 47 positions shown, 18 images · non-contrast
Comparison: Head CT 02/15/2005.

CLINICAL DATA: 43-year-old male status post fall from tree and.
Facial deformity.  Pain.
TECHNIQUE: Multidetector CT imaging of the head, maxillofacial and
cervical spine was performed following the standard protocol
without IV contrast.  Multiplanar CT image reconstructions of the
face and cervical spine were also generated.

CT HEAD

[Series 3: head trauma 4.8 h37s · axial · 0.46mm/px · z∈[+1203,+1262]mm · 2 of 36 slices shown]
[im 12/36  brain]
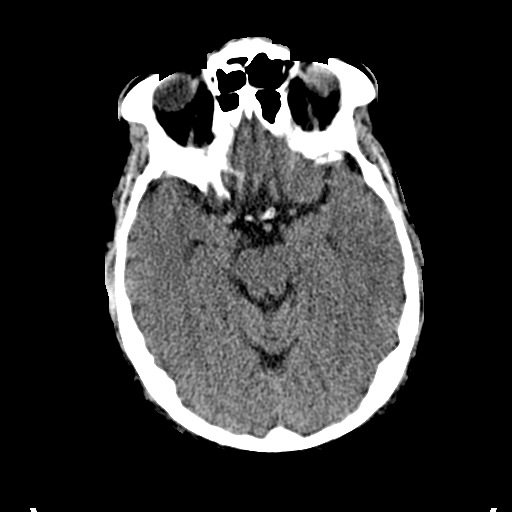
[im 24/36  brain]
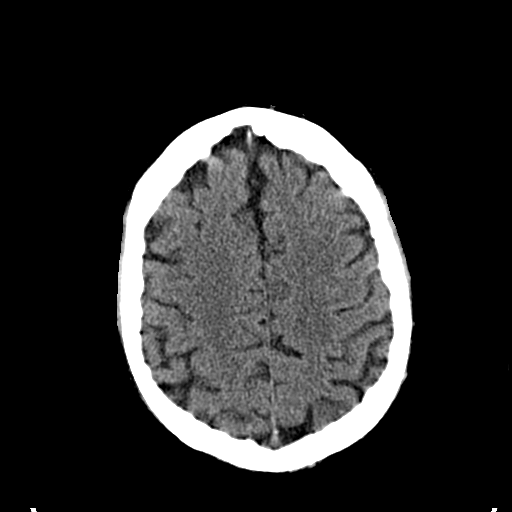

[Series 6: facial 2.0 h31s st · axial · 0.39mm/px · z∈[+1088,+1208]mm · 6 of 86 slices shown, 8 images]
[im 13/86  brain]
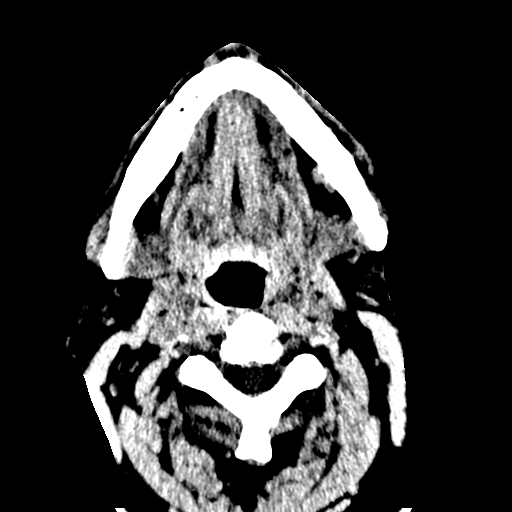
[im 13/86  bone]
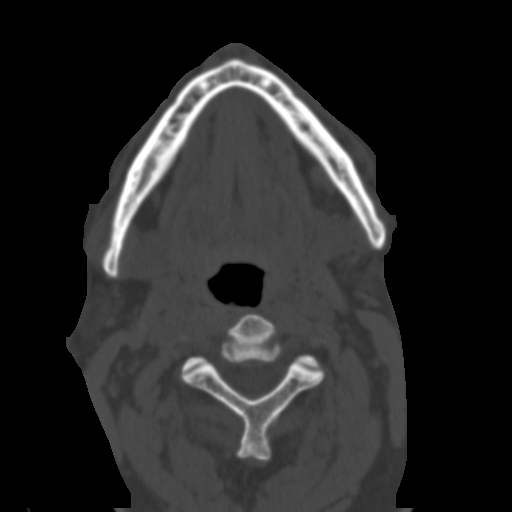
[im 25/86  brain]
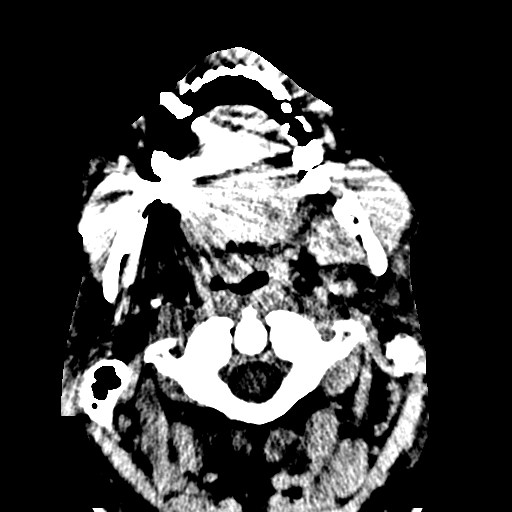
[im 37/86  brain]
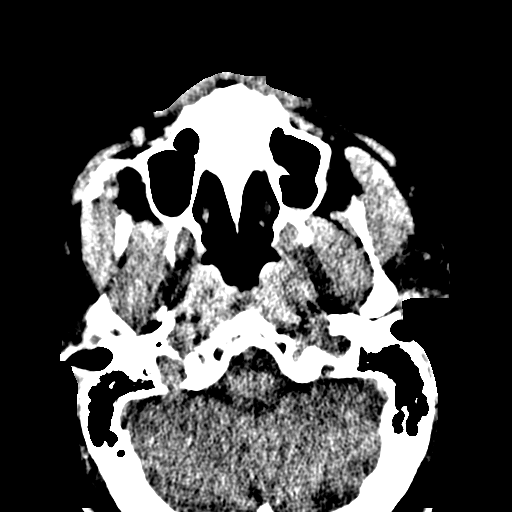
[im 49/86  brain]
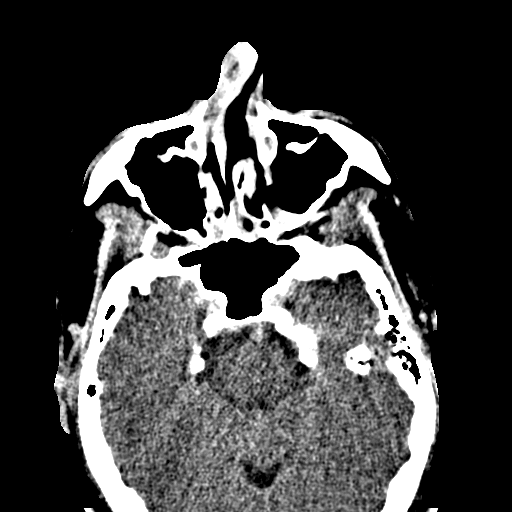
[im 61/86  brain]
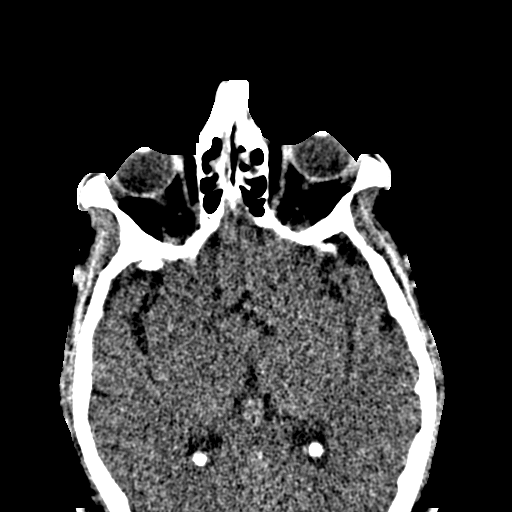
[im 61/86  bone]
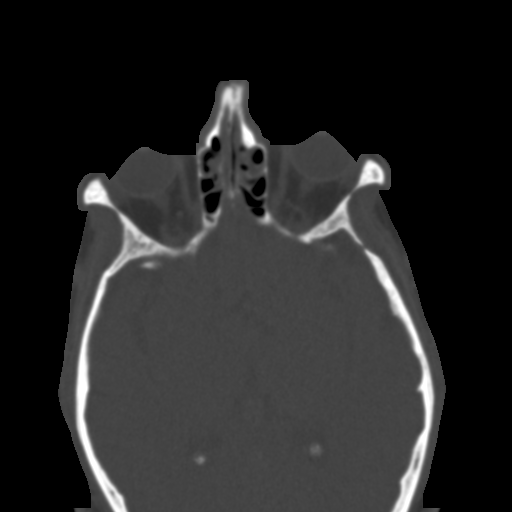
[im 73/86  brain]
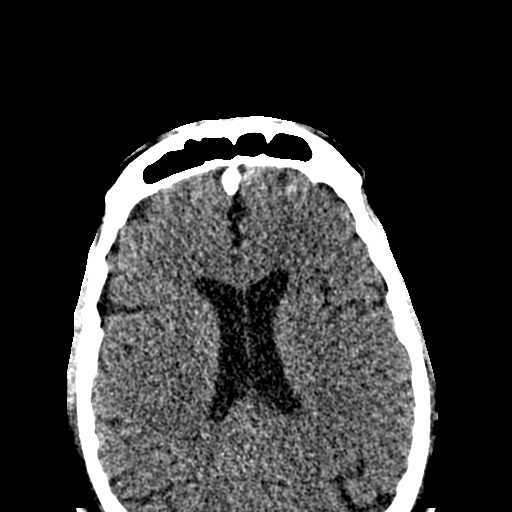

[Series 10: c_spine 2.0 b31s detail · axial · 0.26mm/px · z∈[+982,+1030]mm · 3 of 99 slices shown]
[im 13/99  bone]
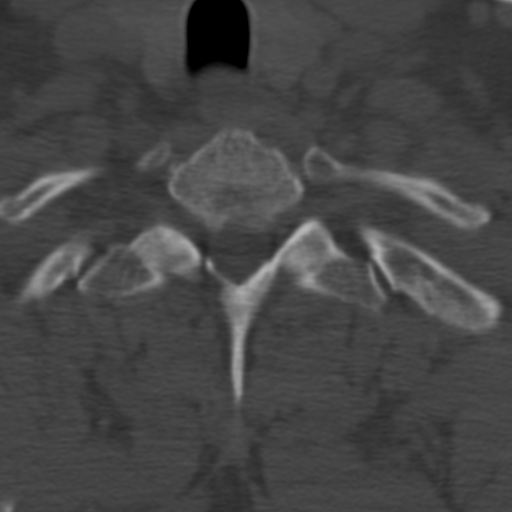
[im 25/99  bone]
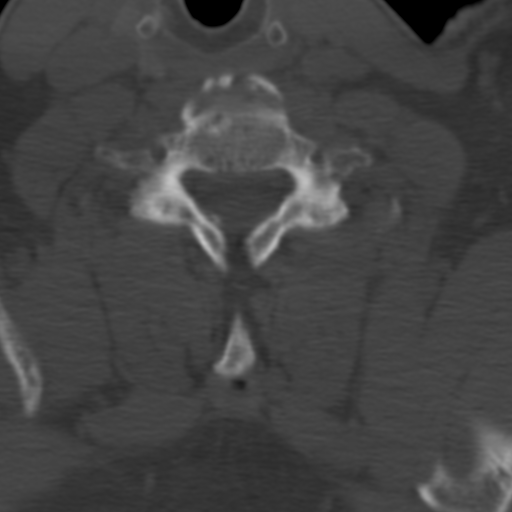
[im 37/99  bone]
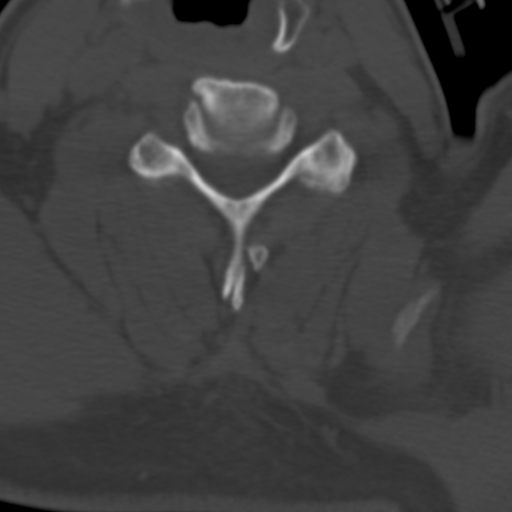

[Series 604: coronal st · coronal · 0.39mm/px · 3 of 61 slices shown]
[im 18/61  brain]
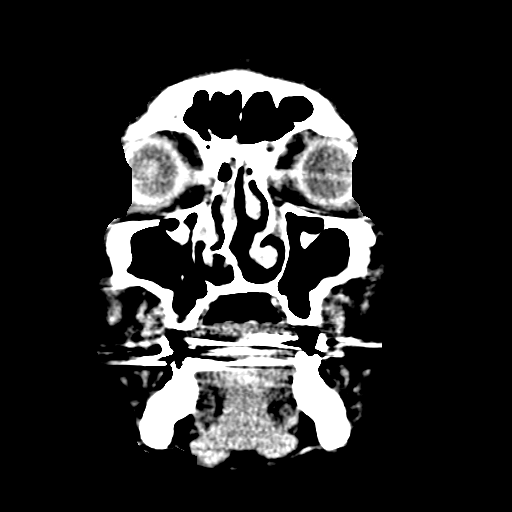
[im 26/61  brain]
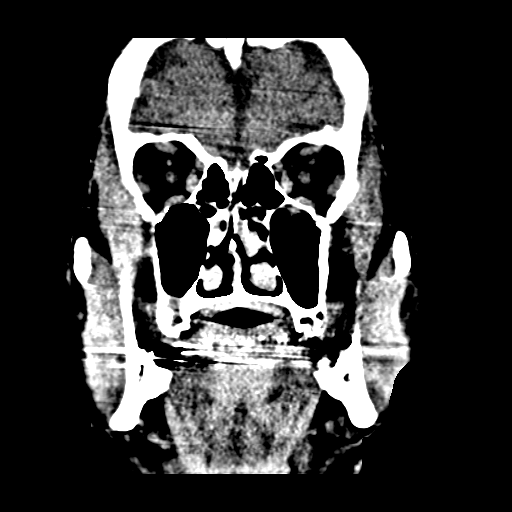
[im 35/61  brain]
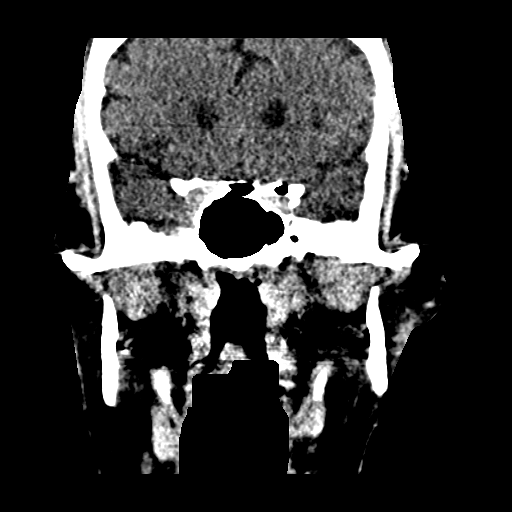

[Series 606: sagittal st · sagittal · 0.39mm/px · 2 of 59 slices shown]
[im 20/59  brain]
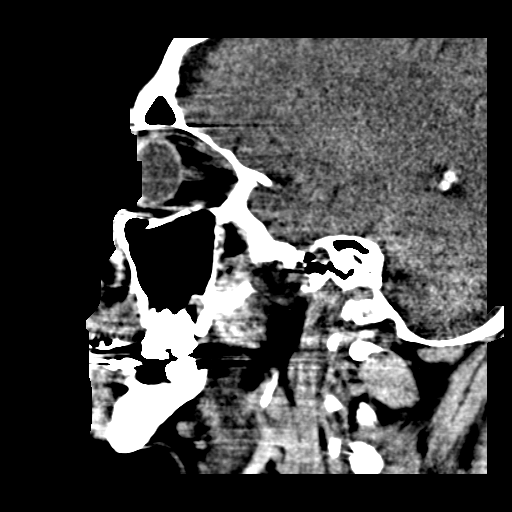
[im 39/59  brain]
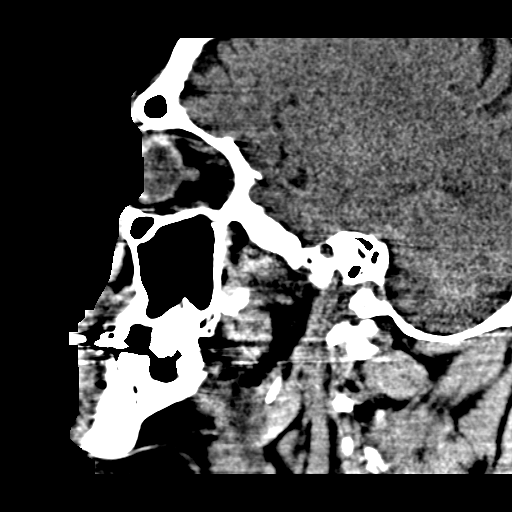

[16 of 47 positions shown; findings below may reference images not displayed]

CT HEAD WITHOUT CONTRAST
CT MAXILLOFACIAL WITHOUT CONTRAST
CT CERVICAL SPINE WITHOUT CONTRAST
FINDINGS: Visualized scalp soft tissues are within normal limits.
Mastoids and tympanic cavities are clear.  Course dural
calcification.  Calvarium appears intact.  Stable mild generalized
volume loss for age.  No midline shift, ventriculomegaly, mass
effect, evidence of mass lesion, intracranial hemorrhage or
evidence of cortically based acute infarction.  Gray-white matter
differentiation is within normal limits throughout the brain.
IMPRESSION: Stable noncontrast CT appearance of the brain.

CT MAXILLOFACIAL
FINDINGS: Visualized orbit soft tissues are within normal limits.
Comminuted nasal bone fractures with leftward deviation of
fragments.  Mild mucosal thickening in the left frontal sinus.
Anterior ethmoid mucosal thickening bilaterally.  Mild mucosal
thickening in the maxillary sinuses.  Possible small volume of
hemorrhage in the left maxillary.  Sphenoid sinuses are clear.
Visualized deep soft tissue spaces of the face and neck are within
normal limits.  Small avulsion fracture at the anterior maxillary
spine.  Bony orbits appear intact.  No other acute facial fracture
identified.
IMPRESSION: 1.  Comminuted nasal bone fractures left greater than right.  A
small avulsion fracture of the anterior maxillary spine.
2.  No other acute facial fracture identified.  Small volume of
hemorrhage in the left maxillary sinus.

CT CERVICAL SPINE
FINDINGS: Lung apices are clear.  Multilevel cervical disc
degeneration, maximal at C6-C7. Visualized skull base is intact.
No atlanto-occipital dissociation.  Cervicothoracic junction
alignment is within normal limits.  Bilateral posterior element
alignment is within normal limits.  No acute cervical fracture.
IMPRESSION: No acute fracture or listhesis identified in the cervical spine.
Ligamentous injury is not excluded.

## 2011-02-07 IMAGING — CR DG HIP (WITH OR WITHOUT PELVIS) 2-3V*L*
4 series · 4 of 4 positions shown · non-contrast
Comparison: None.

CLINICAL DATA: Left hip pain after falling 5 days ago from a tree
stand.  Limited range of motion.

LEFT HIP - COMPLETE 2+ VIEW

[view not recorded (1 of 4)]
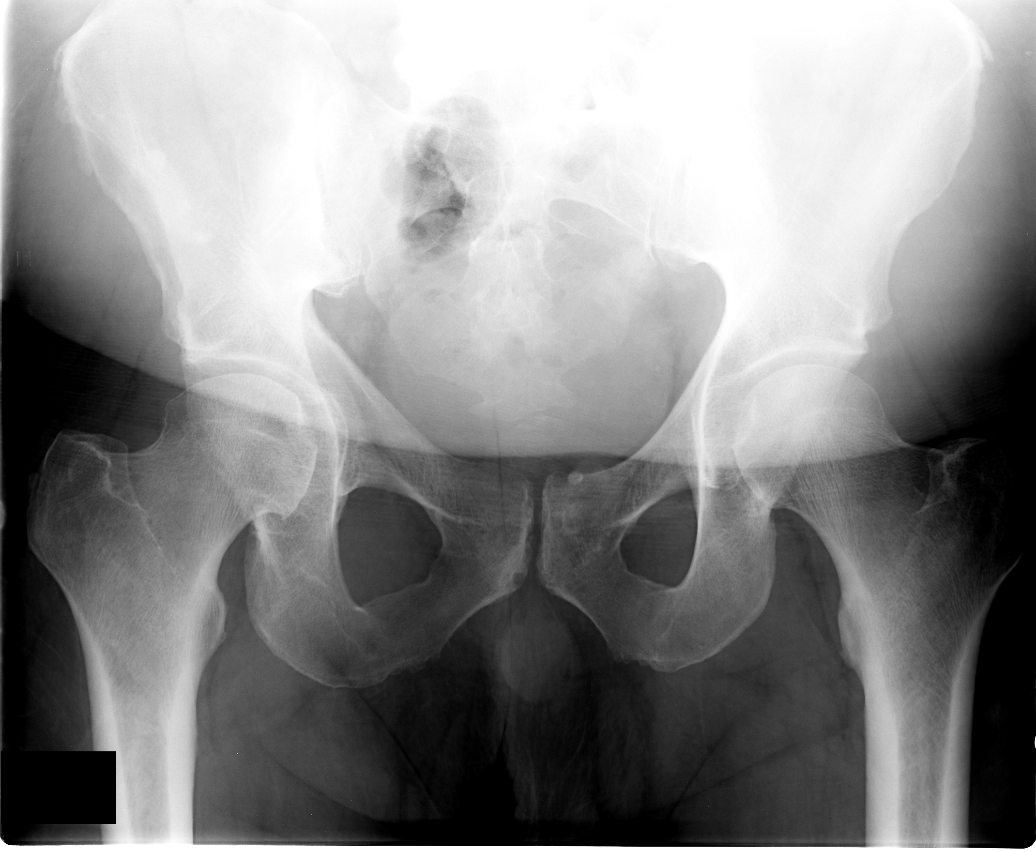

[view not recorded (2 of 4)]
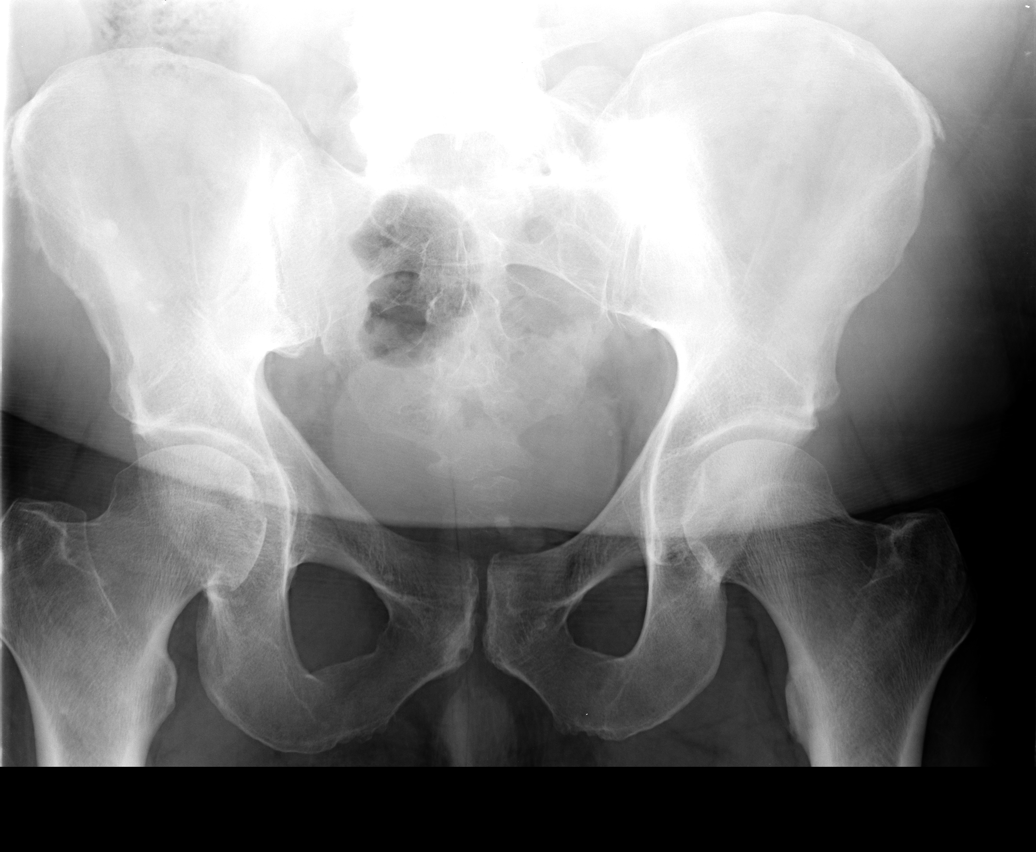

[view not recorded (3 of 4)]
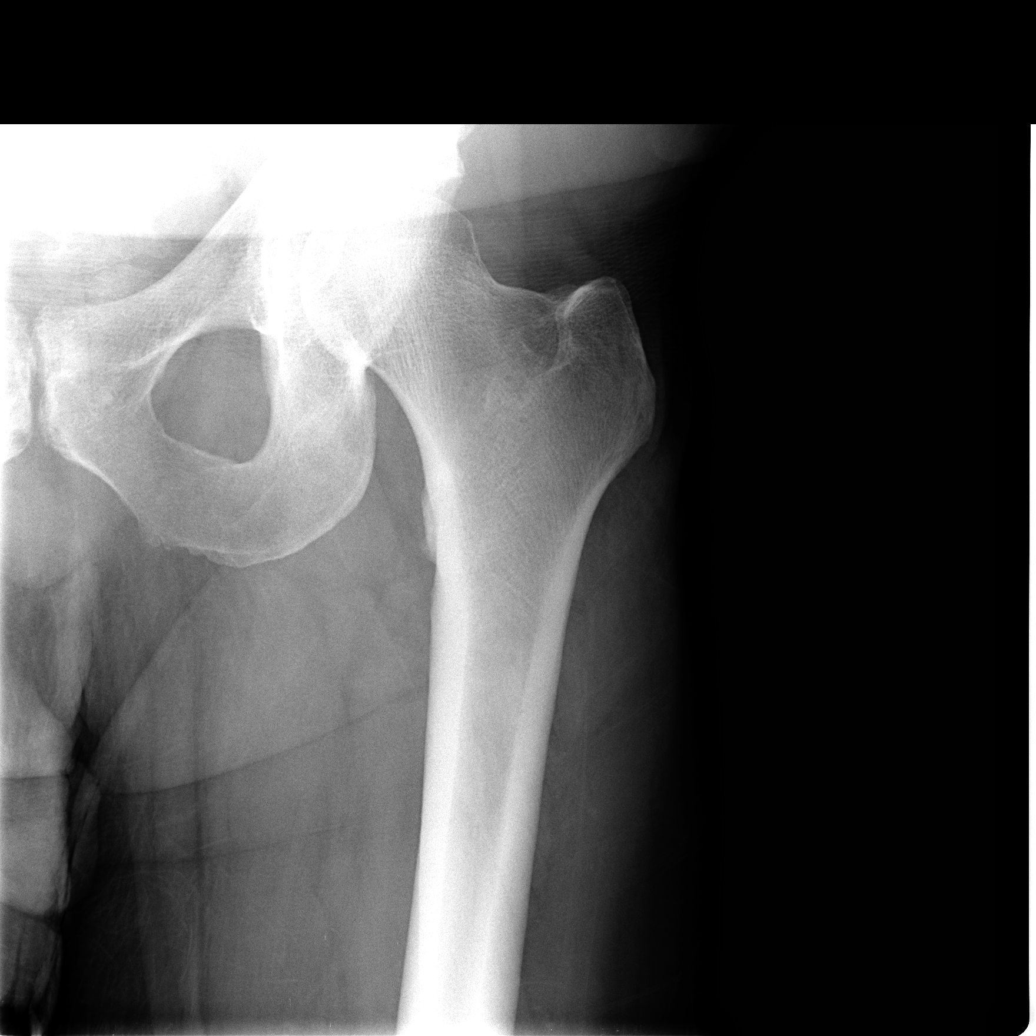

[view not recorded (4 of 4)]
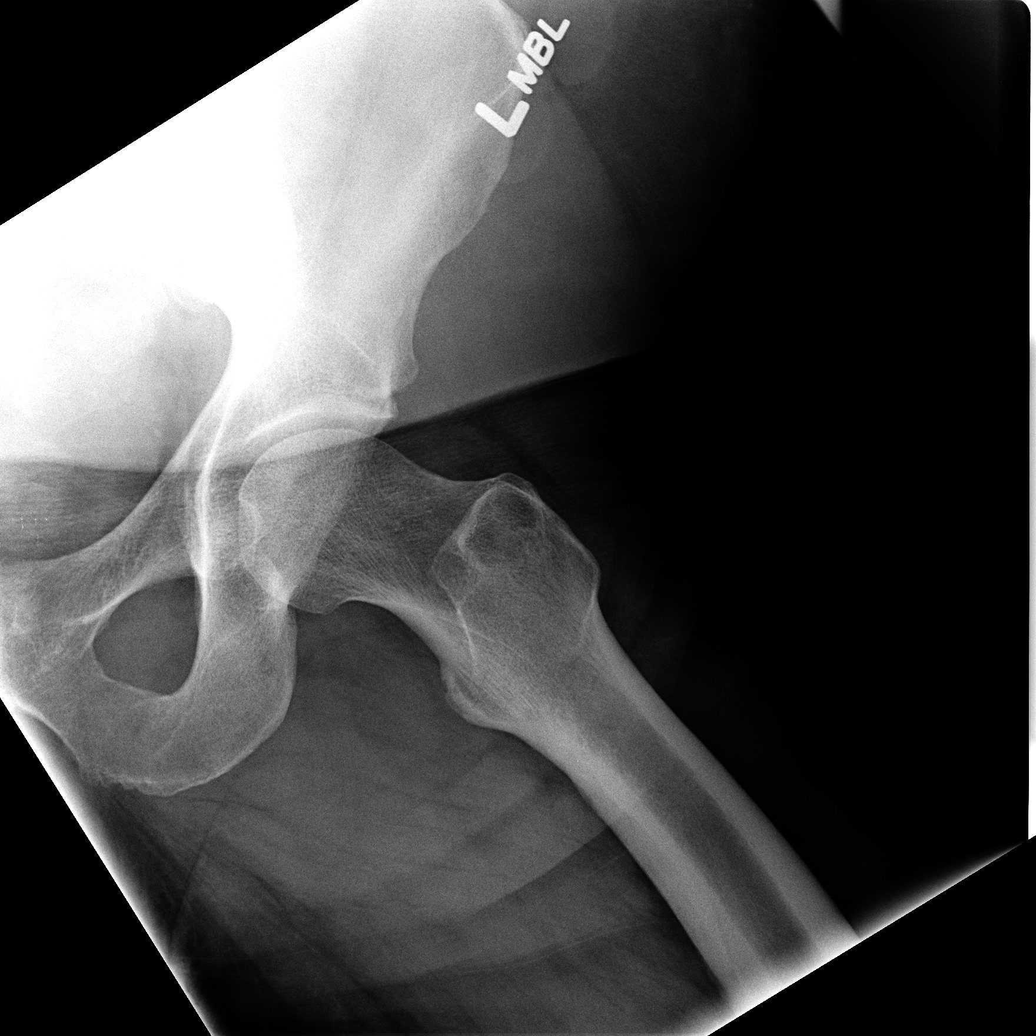

[4 of 4 positions shown; findings below may reference images not displayed]

FINDINGS: There is no fracture, dislocation, or other significant
abnormality of the bony structures of the left hip or pelvis.
IMPRESSION: Normal exam.

## 2014-08-07 ENCOUNTER — Encounter: Payer: Self-pay | Admitting: Podiatry

## 2014-08-07 ENCOUNTER — Ambulatory Visit (INDEPENDENT_AMBULATORY_CARE_PROVIDER_SITE_OTHER): Payer: Managed Care, Other (non HMO) | Admitting: Podiatry

## 2014-08-07 ENCOUNTER — Ambulatory Visit (INDEPENDENT_AMBULATORY_CARE_PROVIDER_SITE_OTHER): Payer: Managed Care, Other (non HMO)

## 2014-08-07 VITALS — BP 140/87 | HR 55 | Resp 18

## 2014-08-07 DIAGNOSIS — M722 Plantar fascial fibromatosis: Secondary | ICD-10-CM

## 2014-08-07 DIAGNOSIS — R52 Pain, unspecified: Secondary | ICD-10-CM | POA: Diagnosis not present

## 2014-08-07 DIAGNOSIS — Q667 Congenital pes cavus: Secondary | ICD-10-CM | POA: Diagnosis not present

## 2014-08-07 DIAGNOSIS — M216X9 Other acquired deformities of unspecified foot: Secondary | ICD-10-CM

## 2014-08-07 MED ORDER — MELOXICAM 15 MG PO TABS: 15.0000 mg | ORAL_TABLET | Freq: Every day | ORAL | Status: DC

## 2014-08-09 NOTE — Progress Notes (Signed)
Subjective:     Patient ID: Maxwell NeerJohn A Reeves, male   DOB: 11/11/1965, 49 y.o.   MRN: [id number]  HPIThis patient presents for initial exam with history of pain both feet.  He says he experiences pain through the bottom of both feet and over the tops of his feet.  He stands and walks significantly at work.  He has previous history of surgery by Dr. Leeanne Deeduchman who also treated him with insoles.  He relates to walking on the outside of hios feet during typical day.  He presents for evaluatuion and treatment.    Review of Systems     Objective:   Physical Exam  Vascular found to be WNL.  Semmes Weinstein monofilament wire test WNL.  He has limited plantarflexion and dorsiflexion ankle B/L.  Limited STJ Motion B/L.  Pain along the course of the peroneal tendon both feet.  Palpable pain along the course of there peroneal tendon both feet.     Assessment:     Plantar fascitis B/L     Plan:     I>E>  X-rays both feet.  Mobic prescribed for this patient.  Powerstep insioles dispensed.

## 2014-08-14 ENCOUNTER — Ambulatory Visit: Payer: Self-pay | Admitting: Podiatry

## 2014-08-26 ENCOUNTER — Ambulatory Visit: Payer: Managed Care, Other (non HMO) | Admitting: Podiatry

## 2014-08-28 ENCOUNTER — Ambulatory Visit: Payer: Managed Care, Other (non HMO) | Admitting: Podiatry

## 2014-09-11 ENCOUNTER — Ambulatory Visit: Payer: Managed Care, Other (non HMO) | Admitting: Podiatry

## 2015-02-17 ENCOUNTER — Ambulatory Visit (INDEPENDENT_AMBULATORY_CARE_PROVIDER_SITE_OTHER): Payer: Managed Care, Other (non HMO) | Admitting: Podiatry

## 2015-02-17 DIAGNOSIS — M722 Plantar fascial fibromatosis: Secondary | ICD-10-CM | POA: Diagnosis not present

## 2015-02-17 NOTE — Progress Notes (Signed)
Subjective:     Patient ID: Maxwell Reeves, male   DOB: 12/02/1965, 49 y.o.   MRN: [id number]  HPI this patient presents to the office with previously diagnosed plantar fasciitis both feet. He was seen by myself in June and was dispensed power step insoles  to be worn in his shoes.  He is states  the insoles have broken down and he has started to have pain again and he has been taking ibuprofen for the pain. He desires to be evaluated and be casted for orthotics through this office   Review of Systems     Objective:   Physical Exam GENERAL APPEARANCE: Alert, conversant. Appropriately groomed. No acute distress.  VASCULAR: [facility]al [facility]  [facility] bilateral.  Capillary refill time is immediate to all digits,  Normal temperature gradient.  Digital hair growth is present bilateral  NEUROLOGIC: sensation is normal to 5.07 monofilament at 5/5 sites bilateral.  Light touch is intact bilateral, Muscle strength normal.  MUSCULOSKELETAL: acceptable muscle strength, tone and stability bilateral.  Intrinsic muscluature intact bilateral.  Rectus appearance of foot and digits noted bilateral. Palpable pain at the insertion plantar fascia both feet.  DERMATOLOGIC: skin color, texture, and turgor are within normal limits.  No preulcerative lesions or ulcers  are seen, no interdigital maceration noted.  No open lesions present.  Digital nails are asymptomatic. No drainage noted.      Assessment:     Plantar fascitis B/L    Plan:     ROV  Casting for orthoses.  Recommended powerstep insoles.  RTC prn.  Helane GuntherGregory Vittorio Mohs DPM

## 2015-04-07 ENCOUNTER — Ambulatory Visit (INDEPENDENT_AMBULATORY_CARE_PROVIDER_SITE_OTHER): Payer: Managed Care, Other (non HMO) | Admitting: Podiatry

## 2015-04-07 DIAGNOSIS — M722 Plantar fascial fibromatosis: Secondary | ICD-10-CM

## 2015-04-07 NOTE — Progress Notes (Signed)
Subjective:     Patient ID: Maxwell Reeves, male   DOB: [birth year], 50 y.o.   MRN: [id number]  HPI this patient returns to the office to pick up his orthotics. He was diagnosed with plantar fasciitis bilaterally and we are today dispensing a pair of orthotics for him to wear. He presents the office for an evaluation and treatment   Review of Systems     Objective:   Physical Exam Physical Exam GENERAL APPEARANCE: Alert, conversant. Appropriately groomed. No acute distress.  VASCULAR: Pedal pulses palpable at [facility] and PT bilateral. Capillary refill time is immediate to all digits, Normal temperature gradient. Digital hair growth is present bilateral  NEUROLOGIC: sensation is normal to 5.07 monofilament at 5/5 sites bilateral. Light touch is intact bilateral, Muscle strength normal.  MUSCULOSKELETAL: acceptable muscle strength, tone and stability bilateral. Intrinsic muscluature intact bilateral. Rectus appearance of foot and digits noted bilateral. Palpable pain at the insertion plantar fascia both feet.  DERMATOLOGIC: skin color, texture, and turgor are within normal limits. No preulcerative lesions or ulcers are seen, no interdigital maceration noted. No open lesions present. Digital nails are asymptomatic. No     Assessment:     Plantar fascitis B/L    Plan:     ROV  Dispense orthoses.  Orthoses fit well to his feet and fit well in his shoes.  RTC prn   Helane Gunther DPM

## 2015-05-15 ENCOUNTER — Ambulatory Visit: Payer: Managed Care, Other (non HMO) | Admitting: Sports Medicine

## 2016-03-22 ENCOUNTER — Ambulatory Visit: Payer: Managed Care, Other (non HMO) | Admitting: Podiatry

## 2017-06-15 DIAGNOSIS — E785 Hyperlipidemia, unspecified: Secondary | ICD-10-CM | POA: Diagnosis not present

## 2017-06-15 DIAGNOSIS — I2510 Atherosclerotic heart disease of native coronary artery without angina pectoris: Secondary | ICD-10-CM | POA: Diagnosis not present

## 2017-06-15 DIAGNOSIS — M869 Osteomyelitis, unspecified: Secondary | ICD-10-CM | POA: Diagnosis not present

## 2017-06-16 DIAGNOSIS — L97529 Non-pressure chronic ulcer of other part of left foot with unspecified severity: Secondary | ICD-10-CM | POA: Diagnosis not present

## 2017-06-16 DIAGNOSIS — E785 Hyperlipidemia, unspecified: Secondary | ICD-10-CM | POA: Diagnosis not present

## 2017-06-16 DIAGNOSIS — M869 Osteomyelitis, unspecified: Secondary | ICD-10-CM | POA: Diagnosis not present

## 2017-06-16 DIAGNOSIS — I2510 Atherosclerotic heart disease of native coronary artery without angina pectoris: Secondary | ICD-10-CM | POA: Diagnosis not present

## 2017-06-17 DIAGNOSIS — M869 Osteomyelitis, unspecified: Secondary | ICD-10-CM | POA: Diagnosis not present

## 2017-06-17 DIAGNOSIS — L97529 Non-pressure chronic ulcer of other part of left foot with unspecified severity: Secondary | ICD-10-CM

## 2017-06-17 DIAGNOSIS — E785 Hyperlipidemia, unspecified: Secondary | ICD-10-CM | POA: Diagnosis not present

## 2017-06-17 DIAGNOSIS — I2510 Atherosclerotic heart disease of native coronary artery without angina pectoris: Secondary | ICD-10-CM | POA: Diagnosis not present

## 2017-06-17 DIAGNOSIS — M79605 Pain in left leg: Secondary | ICD-10-CM | POA: Diagnosis not present

## 2017-06-18 DIAGNOSIS — I2510 Atherosclerotic heart disease of native coronary artery without angina pectoris: Secondary | ICD-10-CM | POA: Diagnosis not present

## 2017-06-18 DIAGNOSIS — M869 Osteomyelitis, unspecified: Secondary | ICD-10-CM | POA: Diagnosis not present

## 2017-06-18 DIAGNOSIS — E785 Hyperlipidemia, unspecified: Secondary | ICD-10-CM | POA: Diagnosis not present

## 2017-06-19 ENCOUNTER — Inpatient Hospital Stay (HOSPITAL_COMMUNITY)
Admission: AD | Admit: 2017-06-19 | Discharge: 2017-06-25 | DRG: 683 | Disposition: A | Discharge status: Home / Self Care | Payer: Managed Care, Other (non HMO) | Source: Other Acute Inpatient Hospital | Attending: Internal Medicine | Admitting: Internal Medicine

## 2017-06-19 ENCOUNTER — Encounter (HOSPITAL_COMMUNITY): Payer: Self-pay | Admitting: Family Medicine

## 2017-06-19 DIAGNOSIS — E785 Hyperlipidemia, unspecified: Secondary | ICD-10-CM | POA: Diagnosis not present

## 2017-06-19 DIAGNOSIS — Z791 Long term (current) use of non-steroidal anti-inflammatories (NSAID): Secondary | ICD-10-CM

## 2017-06-19 DIAGNOSIS — L03116 Cellulitis of left lower limb: Secondary | ICD-10-CM | POA: Diagnosis present

## 2017-06-19 DIAGNOSIS — Z6837 Body mass index (BMI) 37.0-37.9, adult: Secondary | ICD-10-CM

## 2017-06-19 DIAGNOSIS — I2583 Coronary atherosclerosis due to lipid rich plaque: Secondary | ICD-10-CM | POA: Diagnosis not present

## 2017-06-19 DIAGNOSIS — T368X5A Adverse effect of other systemic antibiotics, initial encounter: Secondary | ICD-10-CM | POA: Diagnosis not present

## 2017-06-19 DIAGNOSIS — N179 Acute kidney failure, unspecified: Secondary | ICD-10-CM | POA: Diagnosis present

## 2017-06-19 DIAGNOSIS — I2510 Atherosclerotic heart disease of native coronary artery without angina pectoris: Secondary | ICD-10-CM | POA: Diagnosis present

## 2017-06-19 DIAGNOSIS — N141 Nephropathy induced by other drugs, medicaments and biological substances: Secondary | ICD-10-CM | POA: Diagnosis not present

## 2017-06-19 DIAGNOSIS — L03032 Cellulitis of left toe: Secondary | ICD-10-CM | POA: Diagnosis not present

## 2017-06-19 DIAGNOSIS — M869 Osteomyelitis, unspecified: Secondary | ICD-10-CM | POA: Diagnosis present

## 2017-06-19 DIAGNOSIS — M79672 Pain in left foot: Secondary | ICD-10-CM | POA: Diagnosis present

## 2017-06-19 DIAGNOSIS — N170 Acute kidney failure with tubular necrosis: Principal | ICD-10-CM | POA: Diagnosis present

## 2017-06-19 DIAGNOSIS — E871 Hypo-osmolality and hyponatremia: Secondary | ICD-10-CM | POA: Diagnosis present

## 2017-06-19 DIAGNOSIS — M1990 Unspecified osteoarthritis, unspecified site: Secondary | ICD-10-CM | POA: Diagnosis not present

## 2017-06-19 DIAGNOSIS — E669 Obesity, unspecified: Secondary | ICD-10-CM | POA: Diagnosis present

## 2017-06-19 DIAGNOSIS — I10 Essential (primary) hypertension: Secondary | ICD-10-CM | POA: Diagnosis present

## 2017-06-19 DIAGNOSIS — Z7982 Long term (current) use of aspirin: Secondary | ICD-10-CM | POA: Diagnosis not present

## 2017-06-19 NOTE — Progress Notes (Signed)
Messaged [facility] to inform that patient has arrived.   Larey Dayshristy M Maurina Fawaz, RN

## 2017-06-19 NOTE — Progress Notes (Addendum)
New Admission Note:  Ar[facility] 2330 Mental Orientation: Alert and oriented Telemetry: none, awaiting orders Assessment: Completed Skin: Completed, refer to flowsheets IV: Left forearm S.L.  Pain: States head hurts a little Tubes: Foley [facility] 06/19/2017[facility] Safety Measures: Safety Fall Prevention Plan was given, discussed  Admission: Completed [facility] Orientation: Patient has been orientated to the room, unit and the staff. Family: None at bedside  Orders have been reviewed and implemented. Will continue to monitor the patient. Call light has been placed within reach.  Alfonse Rashristy Munirah Doerner, RN  Phone Number: [phone]

## 2017-06-20 ENCOUNTER — Other Ambulatory Visit: Payer: Self-pay

## 2017-06-20 ENCOUNTER — Encounter (HOSPITAL_COMMUNITY): Payer: Self-pay | Admitting: Family Medicine

## 2017-06-20 DIAGNOSIS — L03032 Cellulitis of left toe: Secondary | ICD-10-CM

## 2017-06-20 DIAGNOSIS — M869 Osteomyelitis, unspecified: Secondary | ICD-10-CM

## 2017-06-20 LAB — CBC WITH DIFFERENTIAL/PLATELET
Basophils Absolute: 0 10*3/uL (ref 0.0–0.1)
Basophils Relative: 0 %
EOS ABS: 0.1 10*3/uL (ref 0.0–0.7)
EOS PCT: 2 %
HCT: 30.7 % — ABNORMAL LOW (ref 39.0–52.0)
Hemoglobin: 10.7 g/dL — ABNORMAL LOW (ref 13.0–17.0)
LYMPHS ABS: 1.1 10*3/uL (ref 0.7–4.0)
Lymphocytes Relative: 16 %
MCH: 32.9 pg (ref 26.0–34.0)
MCHC: 34.9 g/dL (ref 30.0–36.0)
MCV: 94.5 fL (ref 78.0–100.0)
MONOS PCT: 8 %
Monocytes Absolute: 0.6 10*3/uL (ref 0.1–1.0)
NEUTROS PCT: 74 %
Neutro Abs: 5.3 10*3/uL (ref 1.7–7.7)
Platelets: 113 10*3/uL — ABNORMAL LOW (ref 150–400)
RBC: 3.25 MIL/uL — ABNORMAL LOW (ref 4.22–5.81)
RDW: 12.6 % (ref 11.5–15.5)
WBC: 7.1 10*3/uL (ref 4.0–10.5)

## 2017-06-20 LAB — COMPREHENSIVE METABOLIC PANEL
ALK PHOS: 49 U/L (ref 38–126)
ALT: 12 U/L — ABNORMAL LOW (ref 17–63)
ANION GAP: 13 (ref 5–15)
AST: 20 U/L (ref 15–41)
Albumin: 3 g/dL — ABNORMAL LOW (ref 3.5–5.0)
BILIRUBIN TOTAL: 0.7 mg/dL (ref 0.3–1.2)
BUN: 33 mg/dL — ABNORMAL HIGH (ref 6–20)
CALCIUM: 7.5 mg/dL — AB (ref 8.9–10.3)
CO2: 17 mmol/L — ABNORMAL LOW (ref 22–32)
Chloride: 98 mmol/L — ABNORMAL LOW (ref 101–111)
Creatinine, Ser: 7.11 mg/dL — ABNORMAL HIGH (ref 0.61–1.24)
GFR calc non Af Amer: 8 mL/min — ABNORMAL LOW (ref >60)
GFR, EST AFRICAN AMERICAN: 9 mL/min — AB (ref >60)
Glucose, Bld: 92 mg/dL (ref 65–99)
Potassium: 3.6 mmol/L (ref 3.5–5.1)
Sodium: 128 mmol/L — ABNORMAL LOW (ref 135–145)
TOTAL PROTEIN: 5.6 g/dL — AB (ref 6.5–8.1)

## 2017-06-20 LAB — URINALYSIS, ROUTINE W REFLEX MICROSCOPIC
Bilirubin Urine: NEGATIVE
GLUCOSE, UA: NEGATIVE mg/dL
Ketones, ur: NEGATIVE mg/dL
Nitrite: NEGATIVE
PH: 6 (ref 5.0–8.0)
Protein, ur: 100 mg/dL — AB
SPECIFIC GRAVITY, URINE: 1.006 (ref 1.005–1.030)

## 2017-06-20 LAB — CREATININE, URINE, RANDOM: Creatinine, Urine: 65.38 mg/dL

## 2017-06-20 LAB — SODIUM, URINE, RANDOM: Sodium, Ur: 45 mmol/L

## 2017-06-20 LAB — PHOSPHORUS: PHOSPHORUS: 5.9 mg/dL — AB (ref 2.5–4.6)

## 2017-06-20 LAB — MAGNESIUM: MAGNESIUM: 1.1 mg/dL — AB (ref 1.7–2.4)

## 2017-06-20 LAB — HIV ANTIBODY (ROUTINE TESTING W REFLEX): HIV Screen 4th Generation wRfx: NONREACTIVE

## 2017-06-20 MED ORDER — SODIUM CHLORIDE 0.9 % IV SOLN
INTRAVENOUS | Status: DC
  Administered 2017-06-20: 01:00:00 via INTRAVENOUS

## 2017-06-20 MED ORDER — HYDROCODONE-ACETAMINOPHEN 5-325 MG PO TABS
1.0000 | ORAL_TABLET | ORAL | Status: DC | PRN
  Administered 2017-06-20 – 2017-06-24 (×8): 2 via ORAL
  Filled 2017-06-20 (×9): qty 2

## 2017-06-20 MED ORDER — SOD CITRATE-CITRIC ACID 500-334 MG/5ML PO SOLN
30.0000 mL | Freq: Three times a day (TID) | ORAL | Status: DC
  Filled 2017-06-20: qty 30

## 2017-06-20 MED ORDER — ONDANSETRON HCL 4 MG/2ML IJ SOLN
4.0000 mg | Freq: Four times a day (QID) | INTRAMUSCULAR | Status: DC | PRN
  Administered 2017-06-20 – 2017-06-23 (×6): 4 mg via INTRAVENOUS
  Filled 2017-06-20 (×6): qty 2

## 2017-06-20 MED ORDER — AMLODIPINE BESYLATE 10 MG PO TABS
10.0000 mg | ORAL_TABLET | Freq: Every day | ORAL | Status: DC
  Administered 2017-06-21 – 2017-06-25 (×5): 10 mg via ORAL
  Filled 2017-06-20 (×5): qty 1

## 2017-06-20 MED ORDER — ONDANSETRON HCL 4 MG PO TABS
4.0000 mg | ORAL_TABLET | Freq: Four times a day (QID) | ORAL | Status: DC | PRN
  Administered 2017-06-20: 4 mg via ORAL
  Filled 2017-06-20: qty 1

## 2017-06-20 MED ORDER — SODIUM CHLORIDE 0.9% FLUSH
3.0000 mL | Freq: Two times a day (BID) | INTRAVENOUS | Status: DC
  Administered 2017-06-20 (×2): 3 mL via INTRAVENOUS

## 2017-06-20 MED ORDER — CEFAZOLIN SODIUM-DEXTROSE 2-4 GM/100ML-% IV SOLN
2.0000 g | Freq: Three times a day (TID) | INTRAVENOUS | Status: DC
  Administered 2017-06-20: 2 g via INTRAVENOUS
  Filled 2017-06-20 (×2): qty 100

## 2017-06-20 MED ORDER — SENNOSIDES-DOCUSATE SODIUM 8.6-50 MG PO TABS: 1.0000 | ORAL_TABLET | Freq: Every evening | ORAL | Status: DC | PRN

## 2017-06-20 MED ORDER — METOPROLOL SUCCINATE ER 50 MG PO TB24
50.0000 mg | ORAL_TABLET | Freq: Every day | ORAL | Status: DC
  Administered 2017-06-20 – 2017-06-24 (×5): 50 mg via ORAL
  Filled 2017-06-20 (×6): qty 1

## 2017-06-20 MED ORDER — ZOLPIDEM TARTRATE 5 MG PO TABS
10.0000 mg | ORAL_TABLET | Freq: Every evening | ORAL | Status: DC | PRN
  Administered 2017-06-20 – 2017-06-23 (×5): 10 mg via ORAL
  Filled 2017-06-20 (×6): qty 2

## 2017-06-20 MED ORDER — ACETAMINOPHEN 325 MG PO TABS
650.0000 mg | ORAL_TABLET | Freq: Four times a day (QID) | ORAL | Status: DC | PRN
  Administered 2017-06-23 – 2017-06-24 (×2): 650 mg via ORAL
  Filled 2017-06-20 (×3): qty 2

## 2017-06-20 MED ORDER — ACETAMINOPHEN 650 MG RE SUPP: 650.0000 mg | Freq: Four times a day (QID) | RECTAL | Status: DC | PRN

## 2017-06-20 MED ORDER — SODIUM BICARBONATE 650 MG PO TABS
650.0000 mg | ORAL_TABLET | Freq: Three times a day (TID) | ORAL | Status: DC
  Administered 2017-06-20 – 2017-06-25 (×15): 650 mg via ORAL
  Filled 2017-06-20 (×15): qty 1

## 2017-06-20 MED ORDER — CEFAZOLIN SODIUM-DEXTROSE 2-4 GM/100ML-% IV SOLN: 2.0000 g | INTRAVENOUS | Status: DC

## 2017-06-20 MED ORDER — PROMETHAZINE HCL 25 MG/ML IJ SOLN
12.5000 mg | Freq: Four times a day (QID) | INTRAMUSCULAR | Status: DC | PRN
Start: 2017-06-20 — End: 2017-06-25

## 2017-06-20 MED ORDER — HEPARIN SODIUM (PORCINE) 5000 UNIT/ML IJ SOLN
5000.0000 [IU] | Freq: Three times a day (TID) | INTRAMUSCULAR | Status: DC
  Administered 2017-06-20 – 2017-06-25 (×14): 5000 [IU] via SUBCUTANEOUS
  Filled 2017-06-20 (×11): qty 1

## 2017-06-20 NOTE — Consult Note (Addendum)
[facility]  HISTORY AND PHYSICAL  Maxwell Reeves is an 52 y.o. male.    Chief Complaint: Transfer from [name] for AKI  HPI: Pt is a 60M with HLD, HTN, obesity, CAD who is now seen in consultation at the request of Dr. Cathlean Sauer for eval and recs re: AKI.  Pt was mowing his lawn about 10 days ago barefoot and noticed afterwards that he developed some peeling skin and L foot wound.  Started to become tender and sore on the bottom and also the dorsum of the foot so he went to [facility] 4/10 where xrays revealed acute osteo.    He was placed on vanc/ Zosyn and his BP meds of lisinopril/ HCTZ were continued.  Toradol was used for pain.  His vanc level was at a peak of 24.3 on 4/13.    Creatinine trend is as follows: 1.10 (4/10) --> 0.7 (4/11) --> 0.9 (4/12) --> 2.00 (4/13)-- > 4.80 4/14 and is now uptrending to 7.1.  He had a Foley placed and was noted to have blood in the urine after Foley placement.  Pt noting sig bladder spasms.    Vanc/ Zosyn were changed to Ancef 4/12.  He does not appear to have been hypotensive.  He notes that he was started on lipitor about 2 months ago and has had chronic daily headache.  Was taking a fair amount of Excedrin migraine, Tylenol, and Aleve.  CK at [name] was 118.  Renal US without hydronephrosis.  He denies rashes, LE edema, joint stiffness/ pain, nosebleeds, red eyes, SOB, CP.  A little nauseated today.    PMH: HTN HLD Obesity CAD  Medications:  Medications Prior to Admission  Medication Sig Dispense Refill  . aspirin EC 325 MG tablet Take 325 mg by mouth daily.    . halobetasol (ULTRAVATE) 0.05 % cream     . ibuprofen (ADVIL,MOTRIN) 600 MG tablet Take 600 mg by mouth every 6 (six) hours as needed.    Marland Kitchen lisinopril-hydrochlorothiazide (PRINZIDE,ZESTORETIC) 20-25 MG per tablet Take 1 tablet by mouth daily.    . meloxicam (MOBIC) 15 MG tablet Take 1 tablet (15 mg total) by mouth daily. 30 tablet 1  . metoprolol succinate (TOPROL-XL) 50 MG  24 hr tablet Take 50 mg by mouth daily. Take with or immediately following a meal.    . traMADol (ULTRAM) 50 MG tablet Take by mouth every 6 (six) hours as needed.    . zolpidem (AMBIEN) 10 MG tablet Take 10 mg by mouth at bedtime as needed for sleep.      ALLERGIES:  No Known Allergies  FAM HX: History reviewed. No pertinent family history.  Social History:   reports that he has never smoked. He has never used smokeless tobacco. He reports that he does not drink alcohol or use drugs.  ROS: ROS: all other systems reviewed and are negative except as per HPI  Blood pressure 135/74, pulse 66, temperature 98.2 F (36.8 C), temperature source Oral, resp. rate 18, height '6\' 3"'  (1.905 m), weight 135.2 kg (298 lb 1 oz), SpO2 98 %. PHYSICAL EXAM: Physical Exam   GEN older gentleman, NAD HEENT EOMI PERRL NECK no JVD PULM clear bilaterally no c/w/r CV RRR no m/r/g ABD obese EXT no LE edema, blackened toes L foot  NEURO AAO x 3 SKIN  "birthmarks" on bilateral feet which pt says are chronic and he's had for life (they actually look like petechiae but are not) MSK no synovitis or effusions  Results for orders placed or performed during the hospital encounter of 06/19/17 (from the past 48 hour(s))  CBC WITH DIFFERENTIAL     Status: Abnormal   Collection Time: 06/20/17 12:55 AM  Result Value Ref Range   WBC 7.1 4.0 - 10.5 K/uL   RBC 3.25 (L) 4.22 - 5.81 MIL/uL   Hemoglobin 10.7 (L) 13.0 - 17.0 g/dL   HCT 30.7 (L) 39.0 - 52.0 %   MCV 94.5 78.0 - 100.0 fL   MCH 32.9 26.0 - 34.0 pg   MCHC 34.9 30.0 - 36.0 g/dL   RDW 12.6 11.5 - 15.5 %   Platelets 113 (L) 150 - 400 K/uL    Comment: REPEATED TO VERIFY SPECIMEN CHECKED FOR CLOTS PLATELET COUNT CONFIRMED BY SMEAR    Neutrophils Relative % 74 %   Neutro Abs 5.3 1.7 - 7.7 K/uL   Lymphocytes Relative 16 %   Lymphs Abs 1.1 0.7 - 4.0 K/uL   Monocytes Relative 8 %   Monocytes Absolute 0.6 0.1 - 1.0 K/uL   Eosinophils Relative 2 %    Eosinophils Absolute 0.1 0.0 - 0.7 K/uL   Basophils Relative 0 %   Basophils Absolute 0.0 0.0 - 0.1 K/uL    Comment: Performed at [facility], [address] [name], [name] [address]  Comprehensive metabolic panel     Status: Abnormal   Collection Time: 06/20/17 12:55 AM  Result Value Ref Range   Sodium 128 (L) 135 - 145 mmol/L   Potassium 3.6 3.5 - 5.1 mmol/L   Chloride 98 (L) 101 - 111 mmol/L   CO2 17 (L) 22 - 32 mmol/L   Glucose, Bld 92 65 - 99 mg/dL   BUN 33 (H) 6 - 20 mg/dL   Creatinine, Ser 7.11 (H) 0.61 - 1.24 mg/dL   Calcium 7.5 (L) 8.9 - 10.3 mg/dL   Total Protein 5.6 (L) 6.5 - 8.1 g/dL   Albumin 3.0 (L) 3.5 - 5.0 g/dL   AST 20 15 - 41 U/L   ALT 12 (L) 17 - 63 U/L   Alkaline Phosphatase 49 38 - 126 U/L   Total Bilirubin 0.7 0.3 - 1.2 mg/dL   GFR calc non Af Amer 8 (L) >60 mL/min   GFR calc Af Amer 9 (L) >60 mL/min    Comment: (NOTE) The eGFR has been calculated using the CKD EPI equation. This calculation has not been validated in all clinical situations. eGFR's persistently <60 mL/min signify possible Chronic Kidney Disease.    Anion gap 13 5 - 15    Comment: Performed at [facility] [address], [name], [name] [address]  Phosphorus     Status: Abnormal   Collection Time: 06/20/17 12:55 AM  Result Value Ref Range   Phosphorus 5.9 (H) 2.5 - 4.6 mg/dL    Comment: Performed at [facility] [address] [name], [place] [address]  Magnesium     Status: Abnormal   Collection Time: 06/20/17 12:55 AM  Result Value Ref Range   Magnesium 1.1 (L) 1.7 - 2.4 mg/dL    Comment: Performed at [name] [address] [name], [place] [address]  Urinalysis, Routine w reflex microscopic     Status: Abnormal   Collection Time: 06/20/17  2:05 AM  Result Value Ref Range   Color, Urine PINK (A) YELLOW   APPearance HAZY (A) CLEAR   Specific Gravity, Urine 1.006 1.005 - 1.030   pH 6.0 5.0 -  8.0   Glucose, UA NEGATIVE NEGATIVE mg/dL   Hgb  urine dipstick LARGE (A) NEGATIVE   Bilirubin Urine NEGATIVE NEGATIVE   Ketones, ur NEGATIVE NEGATIVE mg/dL   Protein, ur 100 (A) NEGATIVE mg/dL   Nitrite NEGATIVE NEGATIVE   Leukocytes, UA MODERATE (A) NEGATIVE   RBC / HPF TOO NUMEROUS TO COUNT 0 - 5 RBC/hpf   WBC, UA TOO NUMEROUS TO COUNT 0 - 5 WBC/hpf   Bacteria, UA RARE (A) NONE SEEN   Squamous Epithelial / LPF 0-5 (A) NONE SEEN    Comment: Performed at [facility], [place] [address] [place], [place] [address]  Sodium, urine, random     Status: None   Collection Time: 06/20/17  2:05 AM  Result Value Ref Range   Sodium, Ur 45 mmol/L    Comment: Performed at [facility] [address], [place], [place] [address]  Creatinine, urine, random     Status: None   Collection Time: 06/20/17  2:05 AM  Result Value Ref Range   Creatinine, Urine 65.38 mg/dL    Comment: Performed at [facility] [address], [place], [place] [address]    No results found.  Assessment/Plan  1.  AKI: with normal baseline, Cr of 0.7.  He had the administration of several nephrotoxic medications including vancomycin, Zosyn, lisionpril/HCTZ, and toradol.  He is non-oliguric here.  I will send a UP/C and an initial serologic workup. It is possible that hematuria could be from traumatic Foley but need to eval GN.  AIN from antibiotics also a possibility as is syn-infectious GN. Discussed possible need for biopsy if serologies positive.    2.  L foot osteomyelitis: not getting abx here yet.  Per primary  3.  HTN: hold lisinopril/HCTZ.  On amlodipine and metop  4.  Hyponatremia:  Will stop fluids- likely due to free water excretion defect from AKI.  Madelon Lips 06/20/2017, 2:31 PM

## 2017-06-20 NOTE — Progress Notes (Signed)
Dressing to the left foot done with wet to dry dressing per dr Ella JubileeArrien verbal order

## 2017-06-20 NOTE — H&P (Signed)
History and Physical    Maxwell Reeves ZOX:[id number] DOB: [birth year] DOA: 06/19/2017  PCP: Gordan Payment., MD   Patient coming from: Home, by way of [facility]   Chief Complaint: Left foot infection, acute renal failure   HPI/Hospital course: Maxwell Reeves is a 52 y.o. male with medical history significant for hypertension and coronary artery disease, admitted to [facility] on 06/15/2017 for management of nonhealing left foot wounds, developed acute renal failure during the hospitalization, and now seen on transfer to [facility] for further evaluation and management of this.  Patient reported noting a blister at the plantar aspect of his left foot approximately 1 week prior to admission, continued to worsen, but he denied fevers or chills.  He was admitted for management of this and podiatry was consulted.  Radiographs were equivocal for osteomyelitis, MRI foot suggested acute osteomyelitis, and the patient underwent debridement with podiatry.  He was initially treated with vancomycin and Zosyn, but his renal function, normal at time of admission, worsened precipitously.  Vancomycin and Zosyn was switched to Ancef and his lisinopril-HCTZ was discontinued.  He was hydrated with normal saline.  Despite this, renal function continued to worsen, patient has been oliguric, and creatinine continued to climb up to 6.0 on 06/19/2017.  Hospitalist discussed the case with nephrology at [facility] and transfer to this facility was recommended.  Patient continues to be afebrile, has developed nausea during the hospitalization, but denies abdominal pain.   Review of Systems:  All other systems reviewed and apart from HPI, are negative.  History reviewed. No pertinent past medical history.  History reviewed. No pertinent surgical history.   reports that he has never smoked. He has never used smokeless tobacco. He reports that he does not drink alcohol or use drugs.  No Known  Allergies  History reviewed. No pertinent family history.   Prior to Admission medications   Medication Sig Start Date End Date Taking? Authorizing Provider  aspirin EC 325 MG tablet Take 325 mg by mouth daily.    [provider]  halobetasol (ULTRAVATE) 0.05 % cream  05/11/14   [provider]  ibuprofen (ADVIL,MOTRIN) 600 MG tablet Take 600 mg by mouth every 6 (six) hours as needed.    [provider]  lisinopril-hydrochlorothiazide (PRINZIDE,ZESTORETIC) 20-25 MG per tablet Take 1 tablet by mouth daily.    [provider]  meloxicam (MOBIC) 15 MG tablet Take 1 tablet (15 mg total) by mouth daily. 08/07/14   Helane Gunther, DPM  metoprolol succinate (TOPROL-XL) 50 MG 24 hr tablet Take 50 mg by mouth daily. Take with or immediately following a meal.    [provider]  traMADol (ULTRAM) 50 MG tablet Take by mouth every 6 (six) hours as needed.    [provider]  zolpidem (AMBIEN) 10 MG tablet Take 10 mg by mouth at bedtime as needed for sleep.    [provider]    Physical Exam: Vitals:   06/19/17 2327  BP: (!) 142/81  Pulse: 67  Temp: 98.2 F (36.8 C)  TempSrc: Oral  SpO2: 100%  Weight: 135.2 kg (298 lb 1.6 oz)  Height: 6\' 3"  (1.905 m)      Constitutional: NAD, calm  Eyes: PERTLA, lids and conjunctivae normal ENMT: Mucous membranes are moist. Posterior pharynx clear of any exudate or lesions.   Neck: normal, supple, no masses, no thyromegaly Respiratory: clear to auscultation bilaterally, no wheezing, no crackles. Normal respiratory effort.    Cardiovascular:  S1 & S2 heard, regular rate and rhythm. No extremity edema. No significant JVD. Abdomen: No distension, no tenderness, soft. Bowel sounds active.  Musculoskeletal: no clubbing / cyanosis. Left foot wound at plantar aspect.  Skin: Left plantar foot wound. Warm, dry, well-perfused. Neurologic: CN 2-12 grossly intact. Sensation intact. Strength 5/5 in all 4  limbs.  Psychiatric: Alert and oriented x 3. Calm, cooperative.     Labs on Admission: I have personally reviewed following labs and imaging studies  CBC: No results for input(s): WBC, NEUTROABS, HGB, HCT, MCV, PLT in the last 168 hours. Basic Metabolic Panel: No results for input(s): NA, K, CL, CO2, GLUCOSE, BUN, CREATININE, CALCIUM, MG, PHOS in the last 168 hours. GFR: CrCl cannot be calculated (Patient's most recent lab result is older than the maximum 21 days allowed.). Liver Function Tests: No results for input(s): AST, ALT, ALKPHOS, BILITOT, PROT, ALBUMIN in the last 168 hours. No results for input(s): LIPASE, AMYLASE in the last 168 hours. No results for input(s): AMMONIA in the last 168 hours. Coagulation Profile: No results for input(s): INR, PROTIME in the last 168 hours. Cardiac Enzymes: No results for input(s): CKTOTAL, CKMB, CKMBINDEX, TROPONINI in the last 168 hours. BNP (last 3 results) No results for input(s): PROBNP in the last 8760 hours. HbA1C: No results for input(s): HGBA1C in the last 72 hours. CBG: No results for input(s): GLUCAP in the last 168 hours. Lipid Profile: No results for input(s): CHOL, HDL, LDLCALC, TRIG, CHOLHDL, LDLDIRECT in the last 72 hours. Thyroid Function Tests: No results for input(s): TSH, T4TOTAL, FREET4, T3FREE, THYROIDAB in the last 72 hours. Anemia Panel: No results for input(s): VITAMINB12, FOLATE, FERRITIN, TIBC, IRON, RETICCTPCT in the last 72 hours. Urine analysis:    Component Value Date/Time   COLORURINE YELLOW 03/30/2009 1239   APPEARANCEUR CLEAR 03/30/2009 1239   LABSPEC 1.024 03/30/2009 1239   PHURINE 6.5 03/30/2009 1239   GLUCOSEU NEGATIVE 03/30/2009 1239   HGBUR NEGATIVE 03/30/2009 1239   BILIRUBINUR NEGATIVE 03/30/2009 1239   KETONESUR NEGATIVE 03/30/2009 1239   PROTEINUR 30 (A) 03/30/2009 1239   UROBILINOGEN 0.2 03/30/2009 1239   NITRITE NEGATIVE 03/30/2009 1239   LEUKOCYTESUR NEGATIVE 03/30/2009 1239    Sepsis Labs: @LABRCNTIP (procalcitonin:4,lacticidven:4) )No results found for this or any previous visit (from the past 240 hour(s)).   Radiological Exams on Admission: No results found.  EKG: Not performed.   Assessment/Plan   1. Acute renal failure  - Admitted to [facility] on 4/10 with infection involving left foot  - Renal function had remained normal until 4/13 when creatinine increased to 2.00 and continued to increase to 6.00 by 4/14 with oliguria  - Foley was placed and is draining  - Renal US at outside hospital was reported to feature normal kidneys and non-distended bladder  - He was hydrated with NS, vancomycin and Zosyn switched to Ancer, and lisinopril-HCTZ stopped, but renal function continued to worsen  - Nephrologist at [facility] recommended transfer here for further eval and mgmt  - Repeat chem panel now, check urine chemistries, renally-dose medications, avoid nephrotoxins    2. Left foot osteomyelitis  - Presented with non-healing foot wound, radiographs equivocal for osteo, MRI suggested osteo  - He has been afebrile, blood cultures from outside hospital remained negative at time of discharge  - Debridement was performed at outside hospital  - Initially treated with vancomycin and Zosyn, transitioned to Ancef  - Remains afebrile, will check CBC, continue Ancef for now    3. CAD  -  No anginal complaints  - Continue beta-blocker   4. Hypertension  - BP at goal  - Continue Norvasc and Toprol     DVT prophylaxis: sq heparin  Code Status: Full  Family Communication: Discussed with patient Consults called: Nephrology accepted pt in transfer, may need to be re-consulted  Admission status: Inpatient    Briscoe Deutscherimothy S Opyd, MD [facility] Pager [phone]  If 7PM-7AM, please contact night-coverage [email or web address] Password [email or web address]  06/20/2017, 12:32 AM

## 2017-06-20 NOTE — Progress Notes (Signed)
PROGRESS NOTE    Maxwell NeerJohn A Bundrick  ZOX:[id number] DOB: 03/25/1965 DOA: 06/19/2017 PCP: Gordan PaymentGrisso, Greg A., MD    Brief Narrative:  52 year old male who presented left foot infection and worsening kidney function.  He does have a significant past medical history for hypertension and coronary artery disease.  Patient was admitted April 10 [facility], for the management of nonhealing left foot wounds, unfortunately during his hospitalization he developed acute kidney injury that prompted transfer [facility].  Patient had a nonhealing wound on his left foot plantar region for 7 days prior to hospitalization, failed outpatient therapy.  In the hospital he was treated with vancomycin and Zosyn, for osteomyelitis of his left foot.  He underwent debridement.  He had worsening kidney function, serum creatinine reaching up to 6.0 despite avoiding nephrotoxic agents and change in antibiotic therapy.  Urine output significant decreased.  By the time of transfer blood pressure 142/81, heart rate 67, temperature 98.2, oxygen saturation 100%.  Moist mucous membranes, lungs clear to auscultation bilaterally, heart S1-S2 present rhythmic, abdomen soft nontender, left foot wound at the plantar aspect, no lower extremity edema.  Sodium 128, potassium 3.6, chloride 98, bicarb 17, glucose 92, BUN 33, creatinine 7.11, magnesium 1.1, white count 7.1, hemoglobin 10.7, hematocrit 30.7, platelets 113.  Urinalysis, 100 protein, specific gravity 1.006, too numerous to count RBCs, too numerous to count white cells, no evident casts.   Patient was admitted to the hospital working diagnosis of acute kidney injury due to infectious glomerulonephritis, related to left foot osteomyelitis.   Assessment & Plan:   Principal Problem:   Acute renal failure (ARF) (HCC) Active Problems:   Osteomyelitis of left foot (HCC)   CAD (coronary artery disease)   Essential hypertension   1.  Acute kidney injury, oliguric, due to  infectious glomerulonephritis to rule out acute tubular necrosis or vancomycin toxicity-. Worsening creatinine, but no signs of volume overload or uremia, his K at 3,6 and serum bicarbonate at 17. Urinary sodium is 45 with a calculated fraction excretion of sodium or 3,8%, will hold on IV fluids, strict in and out to monitor urine output, add oral sodium bicarbonate, will dc antibiotic therapy and avoid further nephrotoxins. Note serum eosinophils at 2. 06/18/2017 Vancomycin level up to 24.3. If worsening renal function may need renal replacement therapy, will consult nephrology.   2.  Left foot osteomyelitis. [facility], patient received 5 days of antibiotic therapy and surgical debridement, possible joint changes due to arthritis and not osteomyelitis, will hold on antibiotic for now. Wound is dry with no purulence, erythema or edema.   3.  Coronary artery disease. Chest pain free.   4.  Hypertension. Systolic blood pressure 135 to 140, will continue amlodipine and metoprolol for blood pressure control.    DVT prophylaxis:heparin   Code Status:  full Family Communication: no family at the bedside Disposition Plan:  Home when renal function improves   Consultants:   Nephrology   Procedures:   Left foot debridement.   Antimicrobials:       Subjective: Patient with mild nausea, no dyspnea or chest pain, no fever or chills, no rashes. No pain at the left foot.   Objective: Vitals:   06/19/17 2327 06/20/17 0500 06/20/17 0615 06/20/17 0857  BP: (!) 142/81  139/79 135/74  Pulse: 67  64 66  Resp:    18  Temp: 98.2 F (36.8 C)  98.8 F (37.1 C) 98.2 F (36.8 C)  TempSrc: Oral  Oral Oral  SpO2: 100%  97% 98%  Weight: 135.2 kg (298 lb 1.6 oz) 135.2 kg (298 lb 1 oz)    Height: 6\' 3"  (1.905 m)       Intake/Output Summary (Last 24 hours) at 06/20/2017 1114 Last data filed at 06/20/2017 0900 [facility] per 24 hour  Intake 652.5 ml  Output 0 ml  Net 652.5 ml    Filed Weights   06/19/17 2327 06/20/17 0500  Weight: 135.2 kg (298 lb 1.6 oz) 135.2 kg (298 lb 1 oz)    Examination:   General: Not in pain or dyspnea  Neurology: Awake and alert, non focal  E ENT: positive pallor, no icterus, oral mucosa moist Cardiovascular: No JVD. S1-S2 present, rhythmic, no gallops, rubs, or murmurs. No lower extremity edema. Pulmonary: vesicular breath sounds bilaterally, adequate air movement, no wheezing, rhonchi or rales. Gastrointestinal. Abdomen protuberant, no organomegaly, non tender, no rebound or guarding Skin. Mild chronic erythema fore foot bilateral  Musculoskeletal: left foot 2 nd toe with ulcerated lesion, stage 3, clean and dry, with no erythema, no purulence.      Data Reviewed: I have personally reviewed following labs and imaging studies  CBC: Recent Labs  Lab 06/20/17 0055  WBC 7.1  NEUTROABS 5.3  HGB 10.7*  HCT 30.7*  MCV 94.5  PLT 113*   Basic Metabolic Panel: Recent Labs  Lab 06/20/17 0055  NA 128*  K 3.6  CL 98*  CO2 17*  GLUCOSE 92  BUN 33*  CREATININE 7.11*  CALCIUM 7.5*  MG 1.1*  PHOS 5.9*   GFR: Estimated Creatinine Clearance: 17.8 mL/min (A) (by C-G formula based on SCr of 7.11 mg/dL (H)). Liver Function Tests: Recent Labs  Lab 06/20/17 0055  AST 20  ALT 12*  ALKPHOS 49  BILITOT 0.7  PROT 5.6*  ALBUMIN 3.0*   No results for input(s): LIPASE, AMYLASE in the last 168 hours. No results for input(s): AMMONIA in the last 168 hours. Coagulation Profile: No results for input(s): INR, PROTIME in the last 168 hours. Cardiac Enzymes: No results for input(s): CKTOTAL, CKMB, CKMBINDEX, TROPONINI in the last 168 hours. BNP (last 3 results) No results for input(s): PROBNP in the last 8760 hours. HbA1C: No results for input(s): HGBA1C in the last 72 hours. CBG: No results for input(s): GLUCAP in the last 168 hours. Lipid Profile: No results for input(s): CHOL, HDL, LDLCALC, TRIG, CHOLHDL, LDLDIRECT in the  last 72 hours. Thyroid Function Tests: No results for input(s): TSH, T4TOTAL, FREET4, T3FREE, THYROIDAB in the last 72 hours. Anemia Panel: No results for input(s): VITAMINB12, FOLATE, FERRITIN, TIBC, IRON, RETICCTPCT in the last 72 hours.    Radiology Studies: I have reviewed all of the imaging during this hospital visit personally     Scheduled Meds: . [START ON 06/21/2017] amLODipine  10 mg Oral Daily  . heparin  5,000 Units Subcutaneous Q8H  . metoprolol succinate  50 mg Oral Daily  . sodium chloride flush  3 mL Intravenous Q12H   Continuous Infusions: .  ceFAZolin (ANCEF) IV       LOS: 1 day        Coralie Keens, MD [facility] Pager [phone]

## 2017-06-20 NOTE — Progress Notes (Signed)
Pharmacy Antibiotic Note  Maxwell NeerJohn A Hakanson is a 52 y.o. male admitted on 06/19/2017 with worsening renal function and LE osteomyelitis/cellulitis.  Pharmacy has been consulted for Ancef dosing.  Plan: Ancef 2 g IV q24h F/U renal function  Height: 6\' 3"  (190.5 cm) Weight: 298 lb 1.6 oz (135.2 kg) IBW/kg (Calculated) : 84.5  Temp (24hrs), Avg:98.2 F (36.8 C), Min:98.2 F (36.8 C), Max:98.2 F (36.8 C)  Recent Labs  Lab 06/20/17 0055  WBC 7.1  CREATININE 7.11*    Estimated Creatinine Clearance: 17.8 mL/min (A) (by C-G formula based on SCr of 7.11 mg/dL (H)).    No Known Allergies  Eddie Candlebbott, Zechariah Bissonnette Vernon 06/20/2017 1:56 AM

## 2017-06-20 NOTE — Progress Notes (Signed)
Pt alert and oriented x4, no complaints of pain or discomfort.  Bed in low position, call bell within reach.  Bed alarms on and functioning.  Assessment done and charted.  Will continue to monitor and do hourly rounding throughout the shift 

## 2017-06-20 NOTE — Care Management Note (Addendum)
Case Management Note  Patient Details  Name: Maxwell Reeves MRN: [id number] Date of Birth: 09/01/1965  Subjective/Objective:    History of  HTN, CAD, admitted to [facility] 06/15/2017 for management of nonhealing left foot wounds, developed acute renal failure during the hospitalization, transfer to [place] admitted 06/19/17 for ARF      Action/Plan: Nephrology consulted. PCP noted.  Prior to admission patient lived at home.  NCM will continue to monitor for discharge transition needs.  Expected Discharge Date:    To Be determined              Expected Discharge Plan:  Home/Self Care  In-House Referral:     Discharge planning Services  CM Consult  Post Acute Care Choice:    Choice offered to:     DME Arranged:    DME Agency:     HH Arranged:    HH Agency:     Status of Service:  In process, will continue to follow  Yancey FlemingsKimberly R Jasmine Maceachern, RN  Nurse case manager [place] 06/20/2017, 12:03 PM

## 2017-06-21 LAB — BASIC METABOLIC PANEL
ANION GAP: 14 (ref 5–15)
BUN: 47 mg/dL — ABNORMAL HIGH (ref 6–20)
CO2: 18 mmol/L — AB (ref 22–32)
Calcium: 7.8 mg/dL — ABNORMAL LOW (ref 8.9–10.3)
Chloride: 99 mmol/L — ABNORMAL LOW (ref 101–111)
Creatinine, Ser: 8.9 mg/dL — ABNORMAL HIGH (ref 0.61–1.24)
GFR calc Af Amer: 7 mL/min — ABNORMAL LOW (ref >60)
GFR, EST NON AFRICAN AMERICAN: 6 mL/min — AB (ref >60)
GLUCOSE: 86 mg/dL (ref 65–99)
POTASSIUM: 4 mmol/L (ref 3.5–5.1)
Sodium: 131 mmol/L — ABNORMAL LOW (ref 135–145)

## 2017-06-21 LAB — CBC WITH DIFFERENTIAL/PLATELET
BASOS ABS: 0 10*3/uL (ref 0.0–0.1)
Basophils Relative: 0 %
Eosinophils Absolute: 0.3 10*3/uL (ref 0.0–0.7)
Eosinophils Relative: 4 %
HEMATOCRIT: 33.3 % — AB (ref 39.0–52.0)
HEMOGLOBIN: 11.4 g/dL — AB (ref 13.0–17.0)
LYMPHS PCT: 9 %
Lymphs Abs: 0.6 10*3/uL — ABNORMAL LOW (ref 0.7–4.0)
MCH: 32.4 pg (ref 26.0–34.0)
MCHC: 34.2 g/dL (ref 30.0–36.0)
MCV: 94.6 fL (ref 78.0–100.0)
Monocytes Absolute: 0.8 10*3/uL (ref 0.1–1.0)
Monocytes Relative: 12 %
NEUTROS ABS: 5.3 10*3/uL (ref 1.7–7.7)
Neutrophils Relative %: 75 %
Platelets: 130 10*3/uL — ABNORMAL LOW (ref 150–400)
RBC: 3.52 MIL/uL — AB (ref 4.22–5.81)
RDW: 12.4 % (ref 11.5–15.5)
WBC: 7 10*3/uL (ref 4.0–10.5)

## 2017-06-21 LAB — UREA NITROGEN, URINE: Urea Nitrogen, Ur: 120 mg/dL

## 2017-06-21 MED ORDER — COLLAGENASE 250 UNIT/GM EX OINT
TOPICAL_OINTMENT | Freq: Every day | CUTANEOUS | Status: DC
  Administered 2017-06-21 – 2017-06-24 (×4): via TOPICAL
  Filled 2017-06-21: qty 30

## 2017-06-21 MED ORDER — MAGNESIUM OXIDE 400 (241.3 MG) MG PO TABS
400.0000 mg | ORAL_TABLET | Freq: Every day | ORAL | Status: DC
  Administered 2017-06-21 – 2017-06-25 (×5): 400 mg via ORAL
  Filled 2017-06-21 (×5): qty 1

## 2017-06-21 NOTE — Progress Notes (Signed)
  [name] Progress Note    Assessment/ Plan:   1.  AKI: with normal baseline, Cr of 0.7.  He had the administration of several nephrotoxic medications including vancomycin, Zosyn, lisionpril/HCTZ, and toradol.  He is non-oliguric here.  I will send a UP/C and an initial serologic workup. It is possible that hematuria could be from traumatic Foley but need to eval GN.  AIN from antibiotics also a possibility as is syn-infectious GN. Discussed possible need for biopsy if serologies positive and as of today they are all pending.  Cr still rising but there are no indications for dialysis at this time.  Will continue to monitor and make daily assessments.  2.  L foot osteomyelitis: [facility]brid[facility].  Holding abx here.  Wound c/s.  Per primary  3.  HTN: hold lisinopril/HCTZ.  On amlodipine and metop  4.  Hyponatremia:  Will stop fluids- likely due to free water excretion defect from AKI.  This is improving  5.  Hypomagnesemia: repleting  Subjective:    Cr continues to rise but urine output picking up, pt denies uremic symptoms.  Serologies pending.     Objective:   BP 126/77 (BP Location: Right Arm)   Pulse 66   Temp 98.2 F (36.8 C) (Oral)   Resp 18   Ht 6\' 3"  (1.905 m)   Wt 135.2 kg (298 lb 1 oz)   SpO2 98%   BMI 37.26 kg/m   Intake/Output Summary (Last 24 hours) at 06/21/2017 1406 Last data filed at 06/21/2017 1151 Gross per 24 hour  Intake 1080 ml  Output 1450 ml  Net -370 ml   Weight change:   Physical Exam:  GEN older gentleman, NAD, lying flat comfortably in bed HEENT EOMI PERRL NECK no JVD PULM clear bilaterally no c/w/r CV RRR no m/r/g ABD obese, nontender nondistended EXT no LE edema, blackened toes L foot which are dressed in gauze today NEURO AAO x 3 SKIN  "birthmarks" on bilateral feet which pt says are chronic and he's had for life (they actually look like petechiae but are not) MSK no synovitis or  effusions    Imaging: No results found.  Labs: BMET Recent Labs  Lab 06/20/17 0055 06/21/17 0801  NA 128* 131*  K 3.6 4.0  CL 98* 99*  CO2 17* 18*  GLUCOSE 92 86  BUN 33* 47*  CREATININE 7.11* 8.90*  CALCIUM 7.5* 7.8*  PHOS 5.9*  --    CBC Recent Labs  Lab 06/20/17 0055 06/21/17 0801  WBC 7.1 7.0  NEUTROABS 5.3 5.3  HGB 10.7* 11.4*  HCT 30.7* 33.3*  MCV 94.5 94.6  PLT 113* 130*    Medications:    . amLODipine  10 mg Oral Daily  . collagenase   Topical Daily  . heparin  5,000 Units Subcutaneous Q8H  . metoprolol succinate  50 mg Oral Daily  . sodium bicarbonate  650 mg Oral TID      Maxwell ButtnerElizabeth Loyda Costin, MD [facility] pgr [phone] 06/21/2017, 2:06 PM

## 2017-06-21 NOTE — Consult Note (Signed)
WOC Nurse wound consult note Reason for Consult:cellulitis to left second through fifth toes, plantar surface.  Present on admission.  Wound type:infectious Pressure Injury POA: NA Measurement:Second metatarsal:  1.2 cm x 0.3 cm x 0.2 cm  Third through fifth metatarsal: 0.5 cm scabbed lesions, moist and weeping Wound ZOX:[id number]:devitalized tissue Drainage (amount, consistency, odor) minimal serosanguinous  No odor Periwound: Erythema and tenderness Dressing procedure/placement/frequency:Cleanse wounds to left plantar toes with NS and pat dry.  Apply Santyl to wound bed.  Cover with NS moist gauze.  COver and secure with kerlix and tape. Change daily.  Will not follow at this time.  Please re-consult if needed.  Maple HudsonKaren Kaylan Friedmann RN BSN CWON Pager [phone]

## 2017-06-21 NOTE — Progress Notes (Signed)
PROGRESS NOTE    Maxwell Reeves  ZOX:[id number] DOB: 07/18/1965 DOA: 06/19/2017 PCP: Gordan Payment., MD    Brief Narrative:  Maxwell Reeves is a 52 year old male who presented with left foot infection and worsening kidney function, past medical history is significant for hypertension and coronary artery disease. Patient had a nonhealing wound on his left plantar region of the foot x 7 days and failed outpatient therapy, he then presented to [place] hospital for his left foot wound was treated with vancomycin and zosyn and underwent debridement for osteomyelitis of the left foot. Subsequently he developed acute kidney injury and was transferred to [facility]. He had worsening kidney function, serum creatinine reaching up to 6.0 with decreased urine output. By the time of transfer blood pressure was 142/81 mmHg, heart rate 67, temperature 98.2, oxygen saturation 100%. Moist mucous membranes, lungs clear to ausculation bilaterally, heart S1-S2 present, rhythmic, abdomen soft nontender, left foot wound at the plantar aspect, no lower extremity edema. Sodium 128, potassium 3.6, chloride 98, bicarb 17, glucose 92, BUN 33, creatinine 7.11, magnesium 1.1, white count 7.1, hgb 10.7, hematocrit 30.7, platelets 113. UA 100 protein, specific gravity 1.006, too numerous to count RBCs, too numerous to count white cells, no evident casts.  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    Assessment & Plan:   Principal Problem:   Acute renal failure (ARF) (HCC) Active Problems:   Osteomyelitis of left foot (HCC)   CAD (coronary artery disease)   Essential hypertension   Acute kidney injury -Patient has a history of normal baseline, today Cr 8.90, potassium 4.0, BUN 47, bicarbonate 18. -Does not present with any signs concerning for urgent dialysis, no tremor, confusion. -Nephrologist on board and much  appreciated. -Autoimmune serologic workup ordered by nephrology is pending completion, if serology positive will need biopsy. -Urine output in the last 24 hours 1550 ml. Patient reports making a good amount of urine and nursing staff has been changing his foley bag about five times a day.  Left foot osteomyelitis -Patient has remained afebrile since admission to [facility]. -Wound is dry, no purulence, no erythema no edema. -Patient received antibiotics as outpatient, 5 days of antibiotic therapy and underwent surgical debridement. -Holding antibiotics for now as possible joint changes may be due to arthritis and not osteomyelitis. -Continue to monitor.  Coronary artery disease -Denies chest pain.  Hypertension -Systolic blood pressure 113-139, continue amlodipine and metoprolol for blood pressure control. Holding home medication Lisinopril/HCTZ due to its nephrotoxicity.  DVT prophylaxis: Heparin. Code Status:  FULL. Family Communication: None at bedside. Disposition Plan: Home when renal function improves.   Consultants:   Nephrology.  Procedures:   Left foot debridement.  Antimicrobials:   None.   Subjective: Awake, alert and oriented x 3, interactive and calm. He reports feeling bloated and fatigued. He denies any chest pain, fever, chills, rash or pain in his left foot.  Objective: Vitals:   06/20/17 1552 06/20/17 2052 06/21/17 0407 06/21/17 0939  BP: 113/81 138/84 139/88 126/77  Pulse: (!) 58 61 69 66  Resp: 18   18  Temp: 98.1 F (36.7 C) 98.1 F (36.7 C) 98.4 F (36.9 C) 98.2 F (36.8 C)  TempSrc: Oral Oral Oral Oral  SpO2: 99% 100% 98% 98%  Weight:      Height:        Intake/Output Summary (Last 24 hours) at  06/21/2017 1057 Last data filed at 06/21/2017 0900 Gross per 24 hour  Intake 1320 ml  Output 1300 ml  Net 20 ml   Filed Weights   06/19/17 2327 06/20/17 0500  Weight: 135.2 kg (298 lb 1.6 oz) 135.2 kg (298 lb 1 oz)     Examination:  General exam: Appears calm and comfortable  Respiratory system: Clear to auscultation. Respiratory effort normal. Cardiovascular system: S1 & S2 heard, RRR. No murmurs, rubs, gallops or clicks. No pedal edema. Gastrointestinal system: Abdomen is nondistended, soft and nontender. No organomegaly or masses felt. Normal bowel sounds heard. Central nervous system: Alert and oriented. No focal neurological deficits. Extremities: Moves all four extremities. Skin: Dressing in place for left foot wound. Psychiatry: Judgement and insight appear normal. Mood & affect appropriate.     Data Reviewed: I have personally reviewed following labs and imaging studies  CBC: Recent Labs  Lab 06/20/17 0055  WBC 7.1  NEUTROABS 5.3  HGB 10.7*  HCT 30.7*  MCV 94.5  PLT 113*   Basic Metabolic Panel: Recent Labs  Lab 06/20/17 0055 06/21/17 0801  NA 128* 131*  K 3.6 4.0  CL 98* 99*  CO2 17* 18*  GLUCOSE 92 86  BUN 33* 47*  CREATININE 7.11* 8.90*  CALCIUM 7.5* 7.8*  MG 1.1*  --   PHOS 5.9*  --    GFR: Estimated Creatinine Clearance: 14.2 mL/min (A) (by C-G formula based on SCr of 8.9 mg/dL (H)). Liver Function Tests: Recent Labs  Lab 06/20/17 0055  AST 20  ALT 12*  ALKPHOS 49  BILITOT 0.7  PROT 5.6*  ALBUMIN 3.0*   No results for input(s): LIPASE, AMYLASE in the last 168 hours. No results for input(s): AMMONIA in the last 168 hours. Coagulation Profile: No results for input(s): INR, PROTIME in the last 168 hours. Cardiac Enzymes: No results for input(s): CKTOTAL, CKMB, CKMBINDEX, TROPONINI in the last 168 hours. BNP (last 3 results) No results for input(s): PROBNP in the last 8760 hours. HbA1C: No results for input(s): HGBA1C in the last 72 hours. CBG: No results for input(s): GLUCAP in the last 168 hours. Lipid Profile: No results for input(s): CHOL, HDL, LDLCALC, TRIG, CHOLHDL, LDLDIRECT in the last 72 hours. Thyroid Function Tests: No results for  input(s): TSH, T4TOTAL, FREET4, T3FREE, THYROIDAB in the last 72 hours. Anemia Panel: No results for input(s): VITAMINB12, FOLATE, FERRITIN, TIBC, IRON, RETICCTPCT in the last 72 hours. Sepsis Labs: No results for input(s): PROCALCITON, LATICACIDVEN in the last 168 hours.  No results found for this or any previous visit (from the past 240 hour(s)).   Radiology Studies: No results found.  Scheduled Meds: . amLODipine  10 mg Oral Daily  . collagenase   Topical Daily  . heparin  5,000 Units Subcutaneous Q8H  . metoprolol succinate  50 mg Oral Daily  . sodium bicarbonate  650 mg Oral TID   Continuous Infusions:   LOS: 2 days    Time spent: 25 minutes.    Loney LohBeatriz Verdis Koval, PA-S [facility] Pager [phone]  If 7PM-7AM, please contact night-coverage [email or web address] Password [facility] 06/21/2017, 10:57 AM

## 2017-06-21 NOTE — Progress Notes (Signed)
PROGRESS NOTE    Maxwell Reeves  ZOX:[id number] DOB: [birth year] DOA: 06/19/2017 PCP: Gordan Payment., MD    Brief Narrative:  52 year old male who presented left foot infection and worsening kidney function.  He does have a significant past medical history for hypertension and coronary artery disease.  Patient was admitted April 10 to [facility], for the management of nonhealing left foot wounds, unfortunately during his hospitalization he developed acute kidney injury that prompted transfer to [facility].  Patient had a nonhealing wound on his left foot plantar region for 7 days prior to hospitalization, failed outpatient therapy.  In the hospital he was treated with vancomycin and Zosyn, for osteomyelitis of his left foot.  He underwent debridement.  He had worsening kidney function, serum creatinine reaching up to 6.0 despite avoiding nephrotoxic agents and change in antibiotic therapy.  Urine output significant decreased.  By the time of transfer blood pressure 142/81, heart rate 67, temperature 98.2, oxygen saturation 100%.  Moist mucous membranes, lungs clear to auscultation bilaterally, heart S1-S2 present rhythmic, abdomen soft nontender, left foot wound at the plantar aspect, no lower extremity edema.  Sodium 128, potassium 3.6, chloride 98, bicarb 17, glucose 92, BUN 33, creatinine 7.11, magnesium 1.1, white count 7.1, hemoglobin 10.7, hematocrit 30.7, platelets 113.  Urinalysis, 100 protein, specific gravity 1.006, too numerous to count RBCs, too numerous to count white cells, no evident casts.   Patient was admitted to the hospital with the working diagnosis of acute kidney injury due to toxic nephropathy to rule out infectious glomerulonephritis, related to left foot osteomyelitis.  Assessment & Plan:   Principal Problem:   Acute renal failure (ARF) (HCC) Active Problems:   Osteomyelitis of left foot (HCC)   CAD (coronary artery disease)   Essential hypertension  1.  Acute  kidney injury due to toxic nephropathy to roule out infectious glomerulonephritis. Patient with no clinical signs of uremia, no signs of volume overload, serum K at 4.0 with serum bicarbonate at 18. Urine output over last 24 hours, 1,550 ml. Will continue supportive medical therapy, po sodium bicarbonate, follow on renal panel in am. Serologies pending. Continue to follow nephrology recommendations. Avoid hypotension or nephrotoxic medications.   2.  Left foot cellulitis to rule out osteomyelitis/. Possible joint changes due to arthritis and not osteomyelitis, per podiatrist at [facility]. Will continue to hold on antibiotic therapy for now, close observation and local wound care. I explained patient about the decision of stopping antibiotics and the rational, and he agrees.   3.  Coronary artery disease. No chest pain.    4.  Hypertension. On amlodipine and metoprolol. Syustolic blood pressure 120 to 130 systolic.   DVT prophylaxis:heparin   Code Status:  full Family Communication: no family at the bedside Disposition Plan:  Home when renal function improves   Consultants:   Nephrology   Procedures:   Left foot debridement.   Antimicrobials:       Subjective: Patient is feeling well, no confusion or tremors, no nausea or vomiting, tolerating po well. Out of bed to chair.   Objective: Vitals:   06/20/17 1552 06/20/17 2052 06/21/17 0407 06/21/17 0939  BP: 113/81 138/84 139/88 126/77  Pulse: (!) 58 61 69 66  Resp: 18   18  Temp: 98.1 F (36.7 C) 98.1 F (36.7 C) 98.4 F (36.9 C) 98.2 F (36.8 C)  TempSrc: Oral Oral Oral Oral  SpO2: 99% 100% 98% 98%  Weight:      Height:  Intake/Output Summary (Last 24 hours) at 06/21/2017 1630 Last data filed at 06/21/2017 1437 Gross per 24 hour  Intake 1320 ml  Output 1700 ml  Net -380 ml   Filed Weights   06/19/17 2327 06/20/17 0500  Weight: 135.2 kg (298 lb 1.6 oz) 135.2 kg (298 lb 1 oz)    Examination:     General: Not in pain or dyspnea,  Neurology: Awake and alert, non focal  E ENT: no pallor, no icterus, oral mucosa moist Cardiovascular: No JVD. S1-S2 present, rhythmic, no gallops, rubs, or murmurs. No lower extremity edema. Pulmonary: vesicular breath sounds bilaterally, adequate air movement, no wheezing, rhonchi or rales. Gastrointestinal. Abdomen with no organomegaly, non tender, no rebound or guarding Skin. No rashes Musculoskeletal: no joint deformities     Data Reviewed: I have personally reviewed following labs and imaging studies  CBC: Recent Labs  Lab 06/20/17 0055 06/21/17 0801  WBC 7.1 7.0  NEUTROABS 5.3 5.3  HGB 10.7* 11.4*  HCT 30.7* 33.3*  MCV 94.5 94.6  PLT 113* 130*   Basic Metabolic Panel: Recent Labs  Lab 06/20/17 0055 06/21/17 0801  NA 128* 131*  K 3.6 4.0  CL 98* 99*  CO2 17* 18*  GLUCOSE 92 86  BUN 33* 47*  CREATININE 7.11* 8.90*  CALCIUM 7.5* 7.8*  MG 1.1*  --   PHOS 5.9*  --    GFR: Estimated Creatinine Clearance: 14.2 mL/min (A) (by C-G formula based on SCr of 8.9 mg/dL (H)). Liver Function Tests: Recent Labs  Lab 06/20/17 0055  AST 20  ALT 12*  ALKPHOS 49  BILITOT 0.7  PROT 5.6*  ALBUMIN 3.0*   No results for input(s): LIPASE, AMYLASE in the last 168 hours. No results for input(s): AMMONIA in the last 168 hours. Coagulation Profile: No results for input(s): INR, PROTIME in the last 168 hours. Cardiac Enzymes: No results for input(s): CKTOTAL, CKMB, CKMBINDEX, TROPONINI in the last 168 hours. BNP (last 3 results) No results for input(s): PROBNP in the last 8760 hours. HbA1C: No results for input(s): HGBA1C in the last 72 hours. CBG: No results for input(s): GLUCAP in the last 168 hours. Lipid Profile: No results for input(s): CHOL, HDL, LDLCALC, TRIG, CHOLHDL, LDLDIRECT in the last 72 hours. Thyroid Function Tests: No results for input(s): TSH, T4TOTAL, FREET4, T3FREE, THYROIDAB in the last 72 hours. Anemia  Panel: No results for input(s): VITAMINB12, FOLATE, FERRITIN, TIBC, IRON, RETICCTPCT in the last 72 hours.    Radiology Studies: I have reviewed all of the imaging during this hospital visit personally     Scheduled Meds: . amLODipine  10 mg Oral Daily  . collagenase   Topical Daily  . heparin  5,000 Units Subcutaneous Q8H  . magnesium oxide  400 mg Oral Daily  . metoprolol succinate  50 mg Oral Daily  . sodium bicarbonate  650 mg Oral TID   Continuous Infusions:   LOS: 2 days        Yitty Roads Annett Gulaaniel Kaniya Trueheart, MD [facility] Pager [phone]

## 2017-06-22 DIAGNOSIS — I10 Essential (primary) hypertension: Secondary | ICD-10-CM

## 2017-06-22 DIAGNOSIS — I2583 Coronary atherosclerosis due to lipid rich plaque: Secondary | ICD-10-CM

## 2017-06-22 DIAGNOSIS — N179 Acute kidney failure, unspecified: Secondary | ICD-10-CM

## 2017-06-22 DIAGNOSIS — I2510 Atherosclerotic heart disease of native coronary artery without angina pectoris: Secondary | ICD-10-CM

## 2017-06-22 DIAGNOSIS — L03116 Cellulitis of left lower limb: Secondary | ICD-10-CM

## 2017-06-22 LAB — URINALYSIS, ROUTINE W REFLEX MICROSCOPIC
Bilirubin Urine: NEGATIVE
GLUCOSE, UA: NEGATIVE mg/dL
Ketones, ur: NEGATIVE mg/dL
Nitrite: NEGATIVE
PH: 5 (ref 5.0–8.0)
PROTEIN: 100 mg/dL — AB
SPECIFIC GRAVITY, URINE: 1.006 (ref 1.005–1.030)

## 2017-06-22 LAB — PROTEIN ELECTROPHORESIS, SERUM
A/G Ratio: 1 (ref 0.7–1.7)
ALBUMIN ELP: 2.8 g/dL — AB (ref 2.9–4.4)
ALPHA-1-GLOBULIN: 0.4 g/dL (ref 0.0–0.4)
ALPHA-2-GLOBULIN: 1 g/dL (ref 0.4–1.0)
Beta Globulin: 0.9 g/dL (ref 0.7–1.3)
GLOBULIN, TOTAL: 2.8 g/dL (ref 2.2–3.9)
Gamma Globulin: 0.6 g/dL (ref 0.4–1.8)
TOTAL PROTEIN ELP: 5.6 g/dL — AB (ref 6.0–8.5)

## 2017-06-22 LAB — BASIC METABOLIC PANEL
Anion gap: 16 — ABNORMAL HIGH (ref 5–15)
BUN: 54 mg/dL — ABNORMAL HIGH (ref 6–20)
CALCIUM: 7.7 mg/dL — AB (ref 8.9–10.3)
CO2: 17 mmol/L — ABNORMAL LOW (ref 22–32)
Chloride: 97 mmol/L — ABNORMAL LOW (ref 101–111)
Creatinine, Ser: 9.6 mg/dL — ABNORMAL HIGH (ref 0.61–1.24)
GFR calc non Af Amer: 5 mL/min — ABNORMAL LOW (ref >60)
GFR, EST AFRICAN AMERICAN: 6 mL/min — AB (ref >60)
Glucose, Bld: 95 mg/dL (ref 65–99)
Potassium: 3.6 mmol/L (ref 3.5–5.1)
SODIUM: 130 mmol/L — AB (ref 135–145)

## 2017-06-22 LAB — C4 COMPLEMENT: Complement C4, Body Fluid: 23 mg/dL (ref 14–44)

## 2017-06-22 LAB — ANCA TITERS
Atypical P-ANCA titer: <1:20 {titer}
P-ANCA: <1:20 {titer}

## 2017-06-22 LAB — C3 COMPLEMENT: C3 Complement: 146 mg/dL (ref 82–167)

## 2017-06-22 LAB — ANA W/REFLEX IF POSITIVE: ANA: NEGATIVE

## 2017-06-22 LAB — KAPPA/LAMBDA LIGHT CHAINS
KAPPA FREE LGHT CHN: 49.4 mg/L — AB (ref 3.3–19.4)
Kappa, lambda light chain ratio: 1.31 (ref 0.26–1.65)
LAMDA FREE LIGHT CHAINS: 37.7 mg/L — AB (ref 5.7–26.3)

## 2017-06-22 LAB — GLOMERULAR BASEMENT MEMBRANE ANTIBODIES: GBM Ab: 6 units (ref 0–20)

## 2017-06-22 LAB — MAGNESIUM: MAGNESIUM: 1.5 mg/dL — AB (ref 1.7–2.4)

## 2017-06-22 MED ORDER — MAGNESIUM SULFATE 4 GM/100ML IV SOLN
4.0000 g | Freq: Once | INTRAVENOUS | Status: AC
  Administered 2017-06-22: 4 g via INTRAVENOUS
  Filled 2017-06-22: qty 100

## 2017-06-22 NOTE — Progress Notes (Signed)
PROGRESS NOTE    Maxwell Reeves  ZOX:[id number] DOB: Jun 30, 1965 DOA: 06/19/2017 PCP: Gordan Payment., MD   Brief Narrative:  52 year old male who presented left foot infection and worsening kidney function. He does have a significant past medical history for hypertension and coronary artery disease. Patient was admitted April 10 to [facility], for the management of nonhealing left foot wounds, unfortunately during his hospitalization he developed acute kidney injury that prompted transfer to [facility]. Patient had a nonhealing wound on his left foot plantar region for 7 days prior to hospitalization, failed outpatient therapy. In the hospital he was treated with vancomycin and Zosyn, for osteomyelitis of his left foot.He underwent debridement. He had worsening kidney function, serum creatinine reaching up to 6.0 despite avoiding nephrotoxic agents and change in antibiotic therapy.Urine output significant decreased.By the time of transfer blood pressure 142/81, heart rate 67, temperature 98.2, oxygen saturation 100%. Moist mucous membranes, lungs clear to auscultation bilaterally, heart S1-S2 present rhythmic, abdomen soft nontender, left foot wound at the plantar aspect,no lower extremity edema.Sodium 128, potassium 3.6, chloride 98, bicarb 17, glucose 92, BUN 33, creatinine 7.11, magnesium 1.1, white count 7.1, hemoglobin 10.7, hematocrit 30.7, platelets 113.Urinalysis, 100 protein, specific gravity 1.006, too numerous to count RBCs,too numerous to count white cells,no evidentcasts.  Patient was admitted to the hospital with the working diagnosis of acute kidney injury due to toxic nephropathy to rule out infectious glomerulonephritis, related to left foot osteomyelitis.      Assessment & Plan:   Principal Problem:   Acute renal failure (ARF) (HCC) Active Problems:   Osteomyelitis of left foot (HCC)   CAD (coronary artery disease)   Essential hypertension  Cellulitis of left foot  #1 acute renal failure secondary to toxic nephropathy rule out infectious glomerulonephritis  Baseline creatinine 0.7.  Patient noted prior to admission to have been on IV vancomycin, IV Zosyn.  Also noted to have been on lisinopril HCTZ and Toradol.  Patient with improving urine output with a urine output of 2.175 L over the past 24 hours.  Function seems to be increasing less significantly and currently at 9.60.  C3-C4 complement normal.  Further labs pending for suspected infectious glomerulonephritis.  Continue bicarb tablets.  Nephrology following.  Per nephrology no indication for dialysis at this time.  Follow.  2.  Left Foot cellulitis r/o osteomyelitis Status post debridement and IV antibiotics at the outside hospital at [facility].  Per podiatrist at [facility] joint changes due to arthritis and not osteomyelitis.  Currently off antibiotics.  Continue wound care.  3.  Coronary artery disease Currently stable.  ACE inhibitor and HCTZ on hold secondary to acute renal failure.  Continue Toprol-XL.  4.  Hypertension Blood pressure stable.  Continue Norvasc and Toprol-XL.    DVT prophylaxis: Heparin Code Status: Full Family Communication: Updated patient.  No family at bedside. Disposition Plan: Home once renal function back to baseline, clinically improved and per nephrology.   Consultants:   Nephrology: Dr. Signe Colt 06/20/2017  Current nurse Maple Hudson RN 06/21/2017  Procedures:   None  Antimicrobials:  None   Subjective: States good urinary output.  Denies any chest pain or shortness of breath.  Objective: Vitals:   06/22/17 0453 06/22/17 0953 06/22/17 1700 06/22/17 1716  BP: 126/77 115/60  127/75  Pulse: 65 67  61  Resp:  20  20  Temp: 98.2 F (36.8 C) 98.4 F (36.9 C)  97.8 F (36.6 C)  TempSrc: Oral Oral  Oral  SpO2:  99% 100% 100% 98%  Weight:      Height:        Intake/Output Summary (Last 24 hours) at 06/22/2017  1940 Last data filed at 06/22/2017 1718 Gross per 24 hour  Intake 720 ml  Output 1900 ml  Net -1180 ml   Filed Weights   06/19/17 2327 06/20/17 0500 06/21/17 2100  Weight: 135.2 kg (298 lb 1.6 oz) 135.2 kg (298 lb 1 oz) 132.9 kg (293 lb)    Examination:  General exam: Appears calm and comfortable  Respiratory system: Clear to auscultation. Respiratory effort normal. Cardiovascular system: S1 & S2 heard, RRR. No JVD, murmurs, rubs, gallops or clicks. No pedal edema. Gastrointestinal system: Abdomen is nondistended, soft and nontender. No organomegaly or masses felt. Normal bowel sounds heard. Central nervous system: Alert and oriented. No focal neurological deficits. Extremities: Symmetric 5 x 5 power. Skin: No rashes, lesions or ulcers Psychiatry: Judgement and insight appear normal. Mood & affect appropriate.     Data Reviewed: I have personally reviewed following labs and imaging studies  CBC: Recent Labs  Lab 06/20/17 0055 06/21/17 0801  WBC 7.1 7.0  NEUTROABS 5.3 5.3  HGB 10.7* 11.4*  HCT 30.7* 33.3*  MCV 94.5 94.6  PLT 113* 130*   Basic Metabolic Panel: Recent Labs  Lab 06/20/17 0055 06/21/17 0801 06/22/17 0518  NA 128* 131* 130*  K 3.6 4.0 3.6  CL 98* 99* 97*  CO2 17* 18* 17*  GLUCOSE 92 86 95  BUN 33* 47* 54*  CREATININE 7.11* 8.90* 9.60*  CALCIUM 7.5* 7.8* 7.7*  MG 1.1*  --  1.5*  PHOS 5.9*  --   --    GFR: Estimated Creatinine Clearance: 13.1 mL/min (A) (by C-G formula based on SCr of 9.6 mg/dL (H)). Liver Function Tests: Recent Labs  Lab 06/20/17 0055  AST 20  ALT 12*  ALKPHOS 49  BILITOT 0.7  PROT 5.6*  ALBUMIN 3.0*   No results for input(s): LIPASE, AMYLASE in the last 168 hours. No results for input(s): AMMONIA in the last 168 hours. Coagulation Profile: No results for input(s): INR, PROTIME in the last 168 hours. Cardiac Enzymes: No results for input(s): CKTOTAL, CKMB, CKMBINDEX, TROPONINI in the last 168 hours. BNP (last 3  results) No results for input(s): PROBNP in the last 8760 hours. HbA1C: No results for input(s): HGBA1C in the last 72 hours. CBG: No results for input(s): GLUCAP in the last 168 hours. Lipid Profile: No results for input(s): CHOL, HDL, LDLCALC, TRIG, CHOLHDL, LDLDIRECT in the last 72 hours. Thyroid Function Tests: No results for input(s): TSH, T4TOTAL, FREET4, T3FREE, THYROIDAB in the last 72 hours. Anemia Panel: No results for input(s): VITAMINB12, FOLATE, FERRITIN, TIBC, IRON, RETICCTPCT in the last 72 hours. Sepsis Labs: No results for input(s): PROCALCITON, LATICACIDVEN in the last 168 hours.  No results found for this or any previous visit (from the past 240 hour(s)).       Radiology Studies: No results found.      Scheduled Meds: . amLODipine  10 mg Oral Daily  . collagenase   Topical Daily  . heparin  5,000 Units Subcutaneous Q8H  . magnesium oxide  400 mg Oral Daily  . metoprolol succinate  50 mg Oral Daily  . sodium bicarbonate  650 mg Oral TID   Continuous Infusions:    LOS: 3 days    Time spent: 35 minutes    Ramiro Harvest, MD [facility] Pager [phone]  If 7PM-7AM, please contact night-coverage  [email or web address] Password [email or web address] 06/22/2017, 7:40 PM

## 2017-06-22 NOTE — Progress Notes (Signed)
  [facility] Progress Note    Assessment/ Plan:   1. Acute kidney injury: Secondary to AIN. Cr 9.6 (BL 0.7) continues to rise but rate is leveling off. Admitted following administration of several nephrotoxic agents (vanc/zosyn, lisinopril/HCTZ, Toradol). Continues to have adequate UOP, -0.615 L today with foley. FEurea c/w intrinsic which supports nephrotoxic injury. Pending labs for suspected syn-infectious GN. Depending on labs, patient may need biopsy. No indication for dialysis at present. 2.  Left-foot osteomyelitis: Stable s/p debridement and [facility].  Continuing to hold abx here.  Wound c/s.  Per primary. 3.  Hypertension: BP controlled. Holding lisinopril/HCTZ.  On amlodipine and metoprolol. 4.  Hyponatremia: Na 130 today. Holding fluids. Likely due to free water excretion defect from AKI.  Will continue to fluid restrict. 5.  Hypomagnesemia: Repleting. Will reassess today.  Subjective:   Patient says he is doing well today. Continues to have good urine output and has been restricting his fluid intake.    Objective:   BP 126/77   Pulse 65   Temp 98.2 F (36.8 C) (Oral)   Resp 20   Ht 6\' 3"  (1.905 m)   Wt 293 lb (132.9 kg)   SpO2 99%   BMI 36.62 kg/m   Intake/Output Summary (Last 24 hours) at 06/22/2017 0801 Last data filed at 06/22/2017 0600 Gross per 24 hour  Intake 960 ml  Output 2175 ml  Net -1215 ml   Weight change:   Physical Exam: General: obese, NAD with non-toxic appearance HEENT: normocephalic, atraumatic, moist mucous membranes Cardiovascular: regular rate and rhythm without murmurs, rubs, or gallops Lungs: clear to auscultation bilaterally with normal work of breathing Abdomen: soft, non-tender, non-distended, normoactive bowel sounds Skin: warm, dry, no rashes or lesions, cap refill < 2 seconds Extremities: warm and well perfused, normal tone, no edema  Imaging: No results found.  Labs: BMET Recent Labs  Lab  06/20/17 0055 06/21/17 0801 06/22/17 0518  NA 128* 131* 130*  K 3.6 4.0 3.6  CL 98* 99* 97*  CO2 17* 18* 17*  GLUCOSE 92 86 95  BUN 33* 47* 54*  CREATININE 7.11* 8.90* 9.60*  CALCIUM 7.5* 7.8* 7.7*  PHOS 5.9*  --   --    CBC Recent Labs  Lab 06/20/17 0055 06/21/17 0801  WBC 7.1 7.0  NEUTROABS 5.3 5.3  HGB 10.7* 11.4*  HCT 30.7* 33.3*  MCV 94.5 94.6  PLT 113* 130*    Medications:    . amLODipine  10 mg Oral Daily  . collagenase   Topical Daily  . heparin  5,000 Units Subcutaneous Q8H  . magnesium oxide  400 mg Oral Daily  . metoprolol succinate  50 mg Oral Daily  . sodium bicarbonate  650 mg Oral TID      Durward Parcelavid Travarius Lange, DO 06/22/2017, 8:01 AM

## 2017-06-23 DIAGNOSIS — E871 Hypo-osmolality and hyponatremia: Secondary | ICD-10-CM

## 2017-06-23 LAB — CBC
HEMATOCRIT: 30.2 % — AB (ref 39.0–52.0)
HEMOGLOBIN: 10.3 g/dL — AB (ref 13.0–17.0)
MCH: 32.3 pg (ref 26.0–34.0)
MCHC: 34.1 g/dL (ref 30.0–36.0)
MCV: 94.7 fL (ref 78.0–100.0)
PLATELETS: 151 10*3/uL (ref 150–400)
RBC: 3.19 MIL/uL — AB (ref 4.22–5.81)
RDW: 12.5 % (ref 11.5–15.5)
WBC: 4.5 10*3/uL (ref 4.0–10.5)

## 2017-06-23 LAB — BASIC METABOLIC PANEL
ANION GAP: 13 (ref 5–15)
BUN: 58 mg/dL — ABNORMAL HIGH (ref 6–20)
CO2: 19 mmol/L — ABNORMAL LOW (ref 22–32)
Calcium: 8.2 mg/dL — ABNORMAL LOW (ref 8.9–10.3)
Chloride: 99 mmol/L — ABNORMAL LOW (ref 101–111)
Creatinine, Ser: 9.86 mg/dL — ABNORMAL HIGH (ref 0.61–1.24)
GFR calc Af Amer: 6 mL/min — ABNORMAL LOW (ref >60)
GFR, EST NON AFRICAN AMERICAN: 5 mL/min — AB (ref >60)
GLUCOSE: 97 mg/dL (ref 65–99)
Potassium: 4.3 mmol/L (ref 3.5–5.1)
Sodium: 131 mmol/L — ABNORMAL LOW (ref 135–145)

## 2017-06-23 LAB — MAGNESIUM: Magnesium: 2.3 mg/dL (ref 1.7–2.4)

## 2017-06-23 NOTE — Progress Notes (Signed)
  [facility] Progress Note    Assessment/ Plan:   1. Acute kidney injury: Improving with plateau of Cr, now 9.86 (BL 0.7). Secondary to suspected AIN following administration of several nephrotoxic agents (vanc/zosyn, lisinopril/HCTZ, Toradol). Continues to have adequate UOP, -1.71 L today with foley. FEurea c/w intrinsic which supports nephrotoxic injury. Labs for  syn-infectious GN appear negative. Patient may need biopsy. No indication for dialysis at present.  2.  Left-foot osteomyelitis: Stable s/p [facility]brid[facility].  Continuing to hold abx here.  Wound c/s.  Per primary.  3.  Hypertension: BP controlled. Holding lisinopril/HCTZ.  On amlodipine and metoprolol.  4.  Hyponatremia: Improving, 131 today. Holding fluids. Likely due to free water excretion defect from AKI.  Will continue to fluid restrict.  5.  Hypomagnesemia: Resolved. Replete as necessary.   Subjective:   Patient feels well today. Continues to have good urine output. No complaints.   Objective:   BP (!) 111/57 (BP Location: Right Arm)   Pulse 67   Temp 98.2 F (36.8 C) (Oral)   Resp 20   Ht 6\' 3"  (1.905 m)   Wt 292 lb 12.3 oz (132.8 kg)   SpO2 95%   BMI 36.59 kg/m   Intake/Output Summary (Last 24 hours) at 06/23/2017 0804 Last data filed at 06/23/2017 [id number] Gross per 24 hour  Intake 840 ml  Output 2550 ml  Net -1710 ml   Weight change: -3.7 oz (-0.104 kg)  Physical Exam: General: obese, NAD with non-toxic appearance HEENT: normocephalic, atraumatic, moist mucous membranes Cardiovascular: regular rate and rhythm without murmurs, rubs, or gallops Lungs: clear to auscultation bilaterally with normal work of breathing Abdomen: soft, non-tender, non-distended, normoactive bowel sounds Skin: warm, dry, no rashes or lesions, cap refill < 2 seconds Extremities: warm and well perfused, normal tone, no edema  Imaging: No results found.  Labs: BMET Recent Labs  Lab  06/20/17 0055 06/21/17 0801 06/22/17 0518 06/23/17 0500  NA 128* 131* 130* 131*  K 3.6 4.0 3.6 4.3  CL 98* 99* 97* 99*  CO2 17* 18* 17* 19*  GLUCOSE 92 86 95 97  BUN 33* 47* 54* 58*  CREATININE 7.11* 8.90* 9.60* 9.86*  CALCIUM 7.5* 7.8* 7.7* 8.2*  PHOS 5.9*  --   --   --    CBC Recent Labs  Lab 06/20/17 0055 06/21/17 0801 06/23/17 0500  WBC 7.1 7.0 4.5  NEUTROABS 5.3 5.3  --   HGB 10.7* 11.4* 10.3*  HCT 30.7* 33.3* 30.2*  MCV 94.5 94.6 94.7  PLT 113* 130* 151    Medications:    . amLODipine  10 mg Oral Daily  . collagenase   Topical Daily  . heparin  5,000 Units Subcutaneous Q8H  . magnesium oxide  400 mg Oral Daily  . metoprolol succinate  50 mg Oral Daily  . sodium bicarbonate  650 mg Oral TID      Maxwell Parcelavid Kimball Manske, DO 06/23/2017, 8:04 AM

## 2017-06-23 NOTE — Progress Notes (Signed)
PROGRESS NOTE    Maxwell NeerJohn A Copenhaver  WUJ:[id number] DOB: 10/29/1965 DOA: 06/19/2017 PCP: Gordan PaymentGrisso, Greg A., MD   Brief Narrative:  52 year old male who presented left foot infection and worsening kidney function. He does have a significant past medical history for hypertension and coronary artery disease. Patient was admitted April 10 to [facility], for the management of nonhealing left foot wounds, unfortunately during his hospitalization he developed acute kidney injury that prompted transfer to [facility]. Patient had a nonhealing wound on his left foot plantar region for 7 days prior to hospitalization, failed outpatient therapy. In the hospital he was treated with vancomycin and Zosyn, for osteomyelitis of his left foot.He underwent debridement. He had worsening kidney function, serum creatinine reaching up to 6.0 despite avoiding nephrotoxic agents and change in antibiotic therapy.Urine output significant decreased.By the time of transfer blood pressure 142/81, heart rate 67, temperature 98.2, oxygen saturation 100%. Moist mucous membranes, lungs clear to auscultation bilaterally, heart S1-S2 present rhythmic, abdomen soft nontender, left foot wound at the plantar aspect,no lower extremity edema.Sodium 128, potassium 3.6, chloride 98, bicarb 17, glucose 92, BUN 33, creatinine 7.11, magnesium 1.1, white count 7.1, hemoglobin 10.7, hematocrit 30.7, platelets 113.Urinalysis, 100 protein, specific gravity 1.006, too numerous to count RBCs,too numerous to count white cells,no evidentcasts.  Patient was admitted to the hospital with the working diagnosis of acute kidney injury due to toxic nephropathy to rule out infectious glomerulonephritis, related to left foot osteomyelitis.      Assessment & Plan:   Principal Problem:   Acute renal failure (ARF) (HCC) Active Problems:   Osteomyelitis of left foot (HCC)   CAD (coronary artery disease)   Essential hypertension  Cellulitis of left foot   Hyponatremia  #1 acute renal failure secondary to toxic nephropathy rule out infectious glomerulonephritis  Baseline creatinine 0.7.  Patient noted prior to admission to have been on IV vancomycin, IV Zosyn.  Also noted to have been on lisinopril/ HCTZ and Toradol.  Patient urine output of 2.550 L over the past 24 hours.  Creatinine seems to be plateauing and rate of increase of creatinine decreasing.  Creatinine currently at 9.86 from 9.60.  C3-C4 complement normal.  Further labs pending for suspected infectious glomerulonephritis.  Continue bicarb tablets.  Nephrology following.  Per nephrology no indication for dialysis at this time.  Follow.  2.  Left Foot cellulitis r/o osteomyelitis Status post debridement and IV antibiotics [facility].  [facility] joint changes due to arthritis and not osteomyelitis.  Continue wound care.  Afebrile.  Continue to hold off on antibiotics.   3.  Coronary artery disease Currently stable.  ACE inhibitor and HCTZ on hold secondary to acute renal failure.  Continue Toprol-XL.  4.  Hypertension Continue current regimen of Toprol-XL and Norvasc.  5.  Hyponatremia Likely secondary to acute renal failure.  Improving slowly.  Continue to hold IV fluids.    DVT prophylaxis: Heparin Code Status: Full Family Communication: Updated patient.  No family at bedside. Disposition Plan: Home once renal function back to baseline, clinically improved and per nephrology.   Consultants:   Nephrology: Dr. Signe ColtUpton 06/20/2017  Current nurse Maple HudsonKaren Sanders RN 06/21/2017  Procedures:   None  Antimicrobials:  None   Subjective: Patient denies chest pain.  Denies shortness of breath.  Still with good urinary output.    Objective: Vitals:   06/22/17 1716 06/22/17 2048 06/23/17 0601 06/23/17 0923  BP: 127/75 116/79 (!) 111/57 118/69  Pulse: 61 (!) 55 67 67  Resp: 20   18  Temp: 97.8 F (36.6  C) 98 F (36.7 C) 98.2 F (36.8 C) 98.4 F (36.9 C)  TempSrc: Oral Oral Oral Oral  SpO2: 98% 100% 95% 99%  Weight:  132.8 kg (292 lb 12.3 oz)    Height:        Intake/Output Summary (Last 24 hours) at 06/23/2017 1804 Last data filed at 06/23/2017 1400 Gross per 24 hour  Intake 960 ml  Output 2500 ml  Net -1540 ml   Filed Weights   06/20/17 0500 06/21/17 2100 06/22/17 2048  Weight: 135.2 kg (298 lb 1 oz) 132.9 kg (293 lb) 132.8 kg (292 lb 12.3 oz)    Examination:  General exam: Appears calm and comfortable  Respiratory system: Lungs clear to auscultation bilaterally.  No wheezes, no crackles, no rhonchi.  Respiratory effort normal. Cardiovascular system: Regular rate and rhythm no murmurs rubs or gallops.  No JVD.  No lower extremity edema.  Gastrointestinal system: Abdomen is soft, nontender, nondistended, positive bowel sounds.  No rebound.  No guarding. Central nervous system: Alert and oriented. No focal neurological deficits. Extremities: Symmetric 5 x 5 power. Skin: No rashes, lesions or ulcers Psychiatry: Judgement and insight appear normal. Mood & affect appropriate.     Data Reviewed: I have personally reviewed following labs and imaging studies  CBC: Recent Labs  Lab 06/20/17 0055 06/21/17 0801 06/23/17 0500  WBC 7.1 7.0 4.5  NEUTROABS 5.3 5.3  --   HGB 10.7* 11.4* 10.3*  HCT 30.7* 33.3* 30.2*  MCV 94.5 94.6 94.7  PLT 113* 130* 151   Basic Metabolic Panel: Recent Labs  Lab 06/20/17 0055 06/21/17 0801 06/22/17 0518 06/23/17 0500  NA 128* 131* 130* 131*  K 3.6 4.0 3.6 4.3  CL 98* 99* 97* 99*  CO2 17* 18* 17* 19*  GLUCOSE 92 86 95 97  BUN 33* 47* 54* 58*  CREATININE 7.11* 8.90* 9.60* 9.86*  CALCIUM 7.5* 7.8* 7.7* 8.2*  MG 1.1*  --  1.5* 2.3  PHOS 5.9*  --   --   --    GFR: Estimated Creatinine Clearance: 12.7 mL/min (A) (by C-G formula based on SCr of 9.86 mg/dL (H)). Liver Function Tests: Recent Labs  Lab 06/20/17 0055  AST 20  ALT 12*    ALKPHOS 49  BILITOT 0.7  PROT 5.6*  ALBUMIN 3.0*   No results for input(s): LIPASE, AMYLASE in the last 168 hours. No results for input(s): AMMONIA in the last 168 hours. Coagulation Profile: No results for input(s): INR, PROTIME in the last 168 hours. Cardiac Enzymes: No results for input(s): CKTOTAL, CKMB, CKMBINDEX, TROPONINI in the last 168 hours. BNP (last 3 results) No results for input(s): PROBNP in the last 8760 hours. HbA1C: No results for input(s): HGBA1C in the last 72 hours. CBG: No results for input(s): GLUCAP in the last 168 hours. Lipid Profile: No results for input(s): CHOL, HDL, LDLCALC, TRIG, CHOLHDL, LDLDIRECT in the last 72 hours. Thyroid Function Tests: No results for input(s): TSH, T4TOTAL, FREET4, T3FREE, THYROIDAB in the last 72 hours. Anemia Panel: No results for input(s): VITAMINB12, FOLATE, FERRITIN, TIBC, IRON, RETICCTPCT in the last 72 hours. Sepsis Labs: No results for input(s): PROCALCITON, LATICACIDVEN in the last 168 hours.  No results found for this or any previous visit (from the past 240 hour(s)).       Radiology Studies: No results found.      Scheduled Meds: . amLODipine  10 mg Oral Daily  . collagenase   Topical Daily  . heparin  5,000 Units Subcutaneous Q8H  . magnesium oxide  400 mg Oral Daily  . metoprolol succinate  50 mg Oral Daily  . sodium bicarbonate  650 mg Oral TID   Continuous Infusions:    LOS: 4 days    Time spent: 35 minutes    Ramiro Harvest, MD [facility] Pager [phone]  If 7PM-7AM, please contact night-coverage [email or web address] Password [facility] 06/23/2017, 6:04 PM

## 2017-06-24 LAB — RENAL FUNCTION PANEL
Albumin: 3.2 g/dL — ABNORMAL LOW (ref 3.5–5.0)
Anion gap: 13 (ref 5–15)
BUN: 59 mg/dL — AB (ref 6–20)
CHLORIDE: 98 mmol/L — AB (ref 101–111)
CO2: 18 mmol/L — AB (ref 22–32)
CREATININE: 9 mg/dL — AB (ref 0.61–1.24)
Calcium: 8.2 mg/dL — ABNORMAL LOW (ref 8.9–10.3)
GFR calc Af Amer: 7 mL/min — ABNORMAL LOW (ref >60)
GFR calc non Af Amer: 6 mL/min — ABNORMAL LOW (ref >60)
GLUCOSE: 104 mg/dL — AB (ref 65–99)
Phosphorus: 7.2 mg/dL — ABNORMAL HIGH (ref 2.5–4.6)
Potassium: 4.2 mmol/L (ref 3.5–5.1)
Sodium: 129 mmol/L — ABNORMAL LOW (ref 135–145)

## 2017-06-24 NOTE — Progress Notes (Signed)
Call received from [employer] (Case Manager FostoriaJaney), [place] verified.  States if any discharge needs needed she can be reached at [phone], ext [phone].  Also provided (2) home health care providers for patient plan if needed are: [facility] [phone] and [facility]: [phone].   Cloyde ReamsKimberly Becton, BSN, RN Nurse Case Sales executiveManager 

## 2017-06-24 NOTE — Progress Notes (Signed)
PROGRESS NOTE    Maxwell Reeves  WUJ:[id number] DOB: Mar 10, 1965 DOA: 06/19/2017 PCP: Gordan Payment., MD   Brief Narrative:  52 year old male who presented left foot infection and worsening kidney function. He does have a significant past medical history for hypertension and coronary artery disease. Patient was admitted April 10 to [facility], for the management of nonhealing left foot wounds, unfortunately during his hospitalization he developed acute kidney injury that prompted transfer to [facility]. Patient had a nonhealing wound on his left foot plantar region for 7 days prior to hospitalization, failed outpatient therapy. In the hospital he was treated with vancomycin and Zosyn, for osteomyelitis of his left foot.He underwent debridement. He had worsening kidney function, serum creatinine reaching up to 6.0 despite avoiding nephrotoxic agents and change in antibiotic therapy.Urine output significant decreased.By the time of transfer blood pressure 142/81, heart rate 67, temperature 98.2, oxygen saturation 100%. Moist mucous membranes, lungs clear to auscultation bilaterally, heart S1-S2 present rhythmic, abdomen soft nontender, left foot wound at the plantar aspect,no lower extremity edema.Sodium 128, potassium 3.6, chloride 98, bicarb 17, glucose 92, BUN 33, creatinine 7.11, magnesium 1.1, white count 7.1, hemoglobin 10.7, hematocrit 30.7, platelets 113.Urinalysis, 100 protein, specific gravity 1.006, too numerous to count RBCs,too numerous to count white cells,no evidentcasts.  Patient was admitted to the hospital with the working diagnosis of acute kidney injury due to toxic nephropathy to rule out infectious glomerulonephritis, related to left foot osteomyelitis.      Assessment & Plan:   Principal Problem:   Acute renal failure (ARF) (HCC) Active Problems:   Osteomyelitis of left foot (HCC)   CAD (coronary artery disease)   Essential hypertension  Cellulitis of left foot   Hyponatremia  #1 acute renal failure secondary to toxic nephropathy rule out infectious glomerulonephritis  Baseline creatinine 0.7.  Patient noted prior to admission to have been on IV vancomycin, IV Zosyn.  Also noted to have been on lisinopril/ HCTZ and Toradol.  Patient urine output of 1.6 L over the past 24 hours.  Creatinine seems to have plateaued and started to trend back down.  Creatinine currently at 9.00 from 9.86 from 9.60.  C3-C4 complement normal.  Further labs pending for suspected infectious glomerulonephritis.  Continue bicarb tablets.  Nephrology following.  Per nephrology no indication for dialysis at this time.  Follow.  2.  Left Foot cellulitis r/o osteomyelitis Status post debridement and IV antibiotics at the outside hospital at [facility]  Per podiatrist at [facility] joint changes due to arthritis and not osteomyelitis.  Continue wound care.  Afebrile.  Continue to hold off on antibiotics.   3.  Coronary artery disease Stable.  Continue Toprol-XL.  ACE inhibitor and HCTZ on hold secondary to acute renal failure.  4.  Hypertension Well-controlled on current regimen of Toprol-XL and Norvasc.   5.  Hyponatremia Likely secondary to acute renal failure.  Sodium levels fluctuating with diuresis.  Patient auto diuresing.  Follow.     DVT prophylaxis: Heparin Code Status: Full Family Communication: Updated patient.  No family at bedside. Disposition Plan: Home once renal function back to baseline trending down.   Consultants:   Nephrology: Dr. Signe Colt 06/20/2017  Wound care nurse Maple Hudson RN 06/21/2017  Procedures:   None  Antimicrobials:  None   Subjective: Patient laying in bed.  Feeling better.  No chest pain.  No shortness of breath.   Objective: Vitals:   06/23/17 1816 06/23/17 2044 06/24/17 0518 06/24/17 0841  BP: (!) 109/57  121/66 122/73 120/71  Pulse: 65 67 71 73  Resp: 18  18 20   Temp: 98.4 F (36.9 C) 98.5  F (36.9 C) 99 F (37.2 C) 98.5 F (36.9 C)  TempSrc: Oral Oral Oral Oral  SpO2: 98% 95% 93% 96%  Weight:  132.7 kg (292 lb 8.8 oz)    Height:        Intake/Output Summary (Last 24 hours) at 06/24/2017 1334 Last data filed at 06/24/2017 1038 Gross per 24 hour  Intake 1020 ml  Output 750 ml  Net 270 ml   Filed Weights   06/21/17 2100 06/22/17 2048 06/23/17 2044  Weight: 132.9 kg (293 lb) 132.8 kg (292 lb 12.3 oz) 132.7 kg (292 lb 8.8 oz)    Examination:  General exam: NAD. Respiratory system: CTA B.  No crackles, no rhonchi, no wheezing.  Respiratory effort normal. Cardiovascular system: RRR no murmurs rubs or gallops.  No JVD.  No lower extremity edema.   Gastrointestinal system: Abdomen is nontender, nondistended, soft, positive bowel sounds.  No rebound no guarding.   Central nervous system: Alert and oriented. No focal neurological deficits. Extremities: Symmetric 5 x 5 power. Skin: No rashes, lesions or ulcers Psychiatry: Judgement and insight appear normal. Mood & affect appropriate.     Data Reviewed: I have personally reviewed following labs and imaging studies  CBC: Recent Labs  Lab 06/20/17 0055 06/21/17 0801 06/23/17 0500  WBC 7.1 7.0 4.5  NEUTROABS 5.3 5.3  --   HGB 10.7* 11.4* 10.3*  HCT 30.7* 33.3* 30.2*  MCV 94.5 94.6 94.7  PLT 113* 130* 151   Basic Metabolic Panel: Recent Labs  Lab 06/20/17 0055 06/21/17 0801 06/22/17 0518 06/23/17 0500 06/24/17 0409  NA 128* 131* 130* 131* 129*  K 3.6 4.0 3.6 4.3 4.2  CL 98* 99* 97* 99* 98*  CO2 17* 18* 17* 19* 18*  GLUCOSE 92 86 95 97 104*  BUN 33* 47* 54* 58* 59*  CREATININE 7.11* 8.90* 9.60* 9.86* 9.00*  CALCIUM 7.5* 7.8* 7.7* 8.2* 8.2*  MG 1.1*  --  1.5* 2.3  --   PHOS 5.9*  --   --   --  7.2*   GFR: Estimated Creatinine Clearance: 13.9 mL/min (A) (by C-G formula based on SCr of 9 mg/dL (H)). Liver Function Tests: Recent Labs  Lab 06/20/17 0055 06/24/17 0409  AST 20  --   ALT 12*  --     ALKPHOS 49  --   BILITOT 0.7  --   PROT 5.6*  --   ALBUMIN 3.0* 3.2*   No results for input(s): LIPASE, AMYLASE in the last 168 hours. No results for input(s): AMMONIA in the last 168 hours. Coagulation Profile: No results for input(s): INR, PROTIME in the last 168 hours. Cardiac Enzymes: No results for input(s): CKTOTAL, CKMB, CKMBINDEX, TROPONINI in the last 168 hours. BNP (last 3 results) No results for input(s): PROBNP in the last 8760 hours. HbA1C: No results for input(s): HGBA1C in the last 72 hours. CBG: No results for input(s): GLUCAP in the last 168 hours. Lipid Profile: No results for input(s): CHOL, HDL, LDLCALC, TRIG, CHOLHDL, LDLDIRECT in the last 72 hours. Thyroid Function Tests: No results for input(s): TSH, T4TOTAL, FREET4, T3FREE, THYROIDAB in the last 72 hours. Anemia Panel: No results for input(s): VITAMINB12, FOLATE, FERRITIN, TIBC, IRON, RETICCTPCT in the last 72 hours. Sepsis Labs: No results for input(s): PROCALCITON, LATICACIDVEN in the last 168 hours.  No results found for this or any previous  visit (from the past 240 hour(s)).       Radiology Studies: No results found.      Scheduled Meds: . amLODipine  10 mg Oral Daily  . collagenase   Topical Daily  . heparin  5,000 Units Subcutaneous Q8H  . magnesium oxide  400 mg Oral Daily  . metoprolol succinate  50 mg Oral Daily  . sodium bicarbonate  650 mg Oral TID   Continuous Infusions:    LOS: 5 days    Time spent: 35 minutes    Ramiro Harvest, MD [facility] Pager [phone]  If 7PM-7AM, please contact night-coverage [email or web address] Password [email or web address] 06/24/2017, 1:34 PM

## 2017-06-24 NOTE — Progress Notes (Addendum)
   [facility] Progress Note    Assessment/ Plan:   1. Acute kidney injury: Improving Cr, now 9.00 (BL 0.7). Secondary to suspected ATN following administration of several nephrotoxic agents (vanc/zosyn, lisinopril/HCTZ, Toradol). Continues to have adequate UOP, -1.6 L in last 24 hrs without foley now. FEurea c/w intrinsic which supports nephrotoxic injury. Labs for  syn-infectious GN appear negative. No indication for dialysis at present.  Anticipate discharge tomorrow assuming electrolytes are stable and Cr continues to improve.  2.  Left-foot osteomyelitis: Stable s/p debridement and [facility].  Continuing to hold abx here.  Wound c/s.  Per primary.  3.  Hypertension: BP controlled. Holding lisinopril/HCTZ.  On amlodipine and metoprolol.  4.  Hyponatremia: Now 129 today. Holding fluids. Likely due to free water excretion defect from AKI.  Will continue to fluid restrict.  5.  Hypomagnesemia: Resolved. Replete as necessary.   Subjective:   Patient feels well today. No new complaints.   Objective:   BP 122/73 (BP Location: Right Arm)   Pulse 71   Temp 99 F (37.2 C) (Oral)   Resp 18   Ht 6\' 3"  (1.905 m)   Wt 292 lb 8.8 oz (132.7 kg)   SpO2 93%   BMI 36.57 kg/m   Intake/Output Summary (Last 24 hours) at 06/24/2017 0802 Last data filed at 06/24/2017 0636 Gross per 24 hour  Intake 1140 ml  Output 1600 ml  Net -460 ml   Weight change: -3.5 oz (-0.1 kg)  Physical Exam: General: obese, NAD with non-toxic appearance HEENT: normocephalic, atraumatic, moist mucous membranes Cardiovascular: regular rate and rhythm without murmurs, rubs, or gallops Lungs: clear to auscultation bilaterally with normal work of breathing Abdomen: soft, non-tender, non-distended, normoactive bowel sounds Skin: warm, dry, no rashes or lesions, cap refill < 2 seconds Extremities: warm and well perfused, normal tone, no edema  Imaging: No results found.  Labs: BMET Recent  Labs  Lab 06/20/17 0055 06/21/17 0801 06/22/17 0518 06/23/17 0500 06/24/17 0409  NA 128* 131* 130* 131* 129*  K 3.6 4.0 3.6 4.3 4.2  CL 98* 99* 97* 99* 98*  CO2 17* 18* 17* 19* 18*  GLUCOSE 92 86 95 97 104*  BUN 33* 47* 54* 58* 59*  CREATININE 7.11* 8.90* 9.60* 9.86* 9.00*  CALCIUM 7.5* 7.8* 7.7* 8.2* 8.2*  PHOS 5.9*  --   --   --  7.2*   CBC Recent Labs  Lab 06/20/17 0055 06/21/17 0801 06/23/17 0500  WBC 7.1 7.0 4.5  NEUTROABS 5.3 5.3  --   HGB 10.7* 11.4* 10.3*  HCT 30.7* 33.3* 30.2*  MCV 94.5 94.6 94.7  PLT 113* 130* 151    Medications:    . amLODipine  10 mg Oral Daily  . collagenase   Topical Daily  . heparin  5,000 Units Subcutaneous Q8H  . magnesium oxide  400 mg Oral Daily  . metoprolol succinate  50 mg Oral Daily  . sodium bicarbonate  650 mg Oral TID      Durward Parcelavid Tomer Chalmers, DO 06/24/2017, 8:02 AM

## 2017-06-24 NOTE — Progress Notes (Signed)
Up all night with multiple requests. Needs attended.

## 2017-06-25 DIAGNOSIS — N170 Acute kidney failure with tubular necrosis: Principal | ICD-10-CM

## 2017-06-25 LAB — RENAL FUNCTION PANEL
Albumin: 3.4 g/dL — ABNORMAL LOW (ref 3.5–5.0)
Anion gap: 14 (ref 5–15)
BUN: 57 mg/dL — ABNORMAL HIGH (ref 6–20)
CO2: 21 mmol/L — ABNORMAL LOW (ref 22–32)
Calcium: 8.9 mg/dL (ref 8.9–10.3)
Chloride: 97 mmol/L — ABNORMAL LOW (ref 101–111)
Creatinine, Ser: 7.45 mg/dL — ABNORMAL HIGH (ref 0.61–1.24)
GFR calc Af Amer: 9 mL/min — ABNORMAL LOW (ref >60)
GFR calc non Af Amer: 7 mL/min — ABNORMAL LOW (ref >60)
Glucose, Bld: 96 mg/dL (ref 65–99)
Phosphorus: 5.8 mg/dL — ABNORMAL HIGH (ref 2.5–4.6)
Potassium: 3.6 mmol/L (ref 3.5–5.1)
Sodium: 132 mmol/L — ABNORMAL LOW (ref 135–145)

## 2017-06-25 MED ORDER — ASPIRIN EC 81 MG PO TBEC: 81.0000 mg | DELAYED_RELEASE_TABLET | Freq: Every day | ORAL | Status: AC

## 2017-06-25 MED ORDER — COLLAGENASE 250 UNIT/GM EX OINT: TOPICAL_OINTMENT | Freq: Every day | CUTANEOUS | 0 refills | Status: AC

## 2017-06-25 MED ORDER — ACETAMINOPHEN 325 MG PO TABS: 650.0000 mg | ORAL_TABLET | Freq: Four times a day (QID) | ORAL | Status: AC | PRN

## 2017-06-25 MED ORDER — SODIUM BICARBONATE 650 MG PO TABS: 650.0000 mg | ORAL_TABLET | Freq: Two times a day (BID) | ORAL | 0 refills | Status: AC

## 2017-06-25 MED ORDER — AMLODIPINE BESYLATE 10 MG PO TABS: 10.0000 mg | ORAL_TABLET | Freq: Every day | ORAL | 0 refills | Status: AC

## 2017-06-25 MED ORDER — HYDROCODONE-ACETAMINOPHEN 5-325 MG PO TABS: 1.0000 | ORAL_TABLET | ORAL | 0 refills | Status: DC | PRN

## 2017-06-25 NOTE — Discharge Summary (Signed)
Physician Discharge Summary  Mayra NeerJohn A Eickhoff ZOX:[id number] DOB: 05/16/1965 DOA: 06/19/2017  PCP: Gordan PaymentGrisso, Greg A., MD  Admit date: 06/19/2017 Discharge date: 06/25/2017  Time spent: 50 minutes  Recommendations for Outpatient Follow-up:  1. Follow-up with Dr. Signe ColtUpton, [facility] in 2 weeks for follow-up on acute kidney injury felt secondary to ATN.  On follow-up patient will need a renal panel to follow-up on electrolytes and renal function. 2. Follow-up with Gordan PaymentGrisso, Greg A., MD in 2 weeks.  Patient's blood pressure need to be reassessed on follow-up as patient's lisinopril HCTZ were discontinued due to acute kidney injury and patient started on Norvasc in addition to Toprol-XL. 3. Follow up with podiatrist as previously scheduled.   Discharge Diagnoses:  Principal Problem:   Acute renal failure (ARF) (HCC) Active Problems:   Osteomyelitis of left foot (HCC)   CAD (coronary artery disease)   Essential hypertension   Cellulitis of left foot   Hyponatremia   Discharge Condition: stable and improved  Diet recommendation: Regular  Filed Weights   06/22/17 2048 06/23/17 2044 06/24/17 2217  Weight: 132.8 kg (292 lb 12.3 oz) 132.7 kg (292 lb 8.8 oz) 131.3 kg (289 lb 8 oz)    History of present illness:  Per Dr Pearlean Browniepyd Kavonte A Andrepont is a 52 y.o. male with medical history significant for hypertension and coronary artery disease, [facility]/2017 for management of nonhealing left foot wounds, developed acute renal failure during the hospitalization, and now seen on transfer to [facility] for further evaluation and management of this.  Patient reported noting a blister at the plantar aspect of his left foot approximately 1 week prior to admission, continued to worsen, but he denied fevers or chills.  He was admitted for management of this and podiatry was consulted.  Radiographs were equivocal for osteomyelitis, MRI foot suggested acute osteomyelitis,  and the patient underwent debridement with podiatry.  He was initially treated with vancomycin and Zosyn, but his renal function, normal at time of admission, worsened precipitously.  Vancomycin and Zosyn was switched to Ancef and his lisinopril-HCTZ was discontinued.  He was hydrated with normal saline.  Despite this, renal function continued to worsen, patient has been oliguric, and creatinine continued to climb up to 6.0 on 06/19/2017.  Hospitalist discussed the case with nephrology at [facility] and transfer to this facility was recommended.  Patient continued to be afebrile, has developed nausea during the hospitalization, but denied abdominal pain.    Hospital Course:  1 acute renal failure secondary to toxic nephropathy rule out infectious glomerulonephritis  Baseline creatinine 0.7.  Patient noted prior to admission to have a creatinine of 7.1 which went up as high as 9.86.  Felt patient's acute renal failure was secondary to multiple nephrotoxic medications including IV vancomycin, IV Zosyn, lisinopril/HCTZ and Toradol.  Patient was initially noted to be nonoliguric and nephrology consulted.  Patient's IV antibiotics were discontinued during this hospitalization as well as lisinopril/HCTZ and Toradol.  Urine protein and creatinine were also sent off by nephrology.  Patient was noted to have some hematuria however felt to be secondary to traumatic Foley.  Serologies were also obtained.  Patient's urine output was followed and seemed to improve during this hospitalization.  Patient was monitored renal function plateaued and subsequently started to trend down.  C3-C4 complements were normal.  Patient had good urine output and creatinine that trended down to 7.45 by day of discharge.  It is anticipated that patient will have  a post ATN diuresis and renal function returned back to baseline.  Patient will follow up with nephrology 2-3 weeks post discharge at which point in time repeat renal panel will be  obtained to follow-up on patient's electrolytes and renal function.  Patient be discharged in stable and improved condition.   2.  Left Foot cellulitis r/o osteomyelitis Status post debridement and IV antibiotics at the outside hospital at [facility].  Per podiatrist at [facility] joint changes due to arthritis and not osteomyelitis.  Patient was seen by wound care  during this hospitalization and wound care recommendations made.  Patient remained afebrile.  Antibiotics were discontinued.  Outpatient follow-up.   3.  Coronary artery disease Remained stable.  Patient was maintained on home regimen of Toprol-XL.  Patient's ACE inhibitor and HCTZ were discontinued secondary to presentation of acute renal failure.  Outpatient follow-up.    4.  Hypertension Blood pressure remained well-controlled during the hospitalization on his home dose of Toprol-XL in addition of Norvasc 10 mg daily.  Patient's lisinopril HCTZ was discontinued during this hospitalization secondary to acute renal failure.   5.  Hyponatremia Likely secondary to free water excretion defect from acute renal failure.  As patient's renal function improved and patient started to have good oral diuresis hyponatremia improved.  Outpatient follow-up with PCP and nephrology.     Procedures:  None  Consultations:  Nephrology: Dr. Signe Colt 06/20/2017  Wound care nurse Maple Hudson RN 06/21/2017      Discharge Exam: Vitals:   06/24/17 2217 06/25/17 0534  BP: 122/72 131/76  Pulse: 65 69  Resp: 20 18  Temp: 98 F (36.7 C) 98.2 F (36.8 C)  SpO2: 100% 96%    General: NAD Cardiovascular: RRR Respiratory: CTAB  Discharge Instructions   Discharge Instructions    Diet general   Complete by:  As directed    Increase activity slowly   Complete by:  As directed      Allergies as of 06/25/2017      Reactions   Codeine Rash      Medication List    STOP taking these medications   ibuprofen 600 MG  tablet Commonly known as:  ADVIL,MOTRIN   lisinopril-hydrochlorothiazide 20-25 MG tablet Commonly known as:  PRINZIDE,ZESTORETIC   traMADol 50 MG tablet Commonly known as:  ULTRAM     TAKE these medications   acetaminophen 325 MG tablet Commonly known as:  TYLENOL Take 2 tablets (650 mg total) by mouth every 6 (six) hours as needed for mild pain (or Fever >/= 101).   amLODipine 10 MG tablet Commonly known as:  NORVASC Take 1 tablet (10 mg total) by mouth daily.   aspirin EC 81 MG tablet Take 1 tablet (81 mg total) by mouth daily. What changed:    medication strength  how much to take   collagenase ointment Commonly known as:  SANTYL Apply topically daily. :Cleanse wounds to left plantar toes with NS and pat dry.  Apply Santyl to wound bed.  Cover with NS moist gauze.  COver and secure with kerlix and tape. Change daily.   HYDROcodone-acetaminophen 5-325 MG tablet Commonly known as:  NORCO/VICODIN Take 1-2 tablets by mouth every 4 (four) hours as needed for moderate pain or severe pain.   metoprolol succinate 50 MG 24 hr tablet Commonly known as:  TOPROL-XL Take 50 mg by mouth daily. Take with or immediately following a meal.   sodium bicarbonate 650 MG tablet Take 1 tablet (650 mg total) by mouth  2 (two) times daily for 5 days.   zolpidem 10 MG tablet Commonly known as:  AMBIEN Take 10 mg by mouth at bedtime.      Allergies  Allergen Reactions  . Codeine Rash   Follow-up Information    Gordan Payment., MD. Schedule an appointment as soon as possible for a visit in 2 week(s).   Specialty:  Internal Medicine Contact information: [address] [place] [address] [phone]        Bufford Buttner, MD. Schedule an appointment as soon as possible for a visit in 2 week(s).   Specialty:  Nephrology Why:  Office will call with appointment time. Contact information: [address] [place] [address] [phone]        podiatrist Follow up.   Why:   Follow-up as previously scheduled           The results of significant diagnostics from this hospitalization (including imaging, microbiology, ancillary and laboratory) are listed below for reference.    Significant Diagnostic Studies: No results found.  Microbiology: No results found for this or any previous visit (from the past 240 hour(s)).   Labs: Basic Metabolic Panel: Recent Labs  Lab 06/20/17 0055 06/21/17 0801 06/22/17 0518 06/23/17 0500 06/24/17 0409 06/25/17 0605  NA 128* 131* 130* 131* 129* 132*  K 3.6 4.0 3.6 4.3 4.2 3.6  CL 98* 99* 97* 99* 98* 97*  CO2 17* 18* 17* 19* 18* 21*  GLUCOSE 92 86 95 97 104* 96  BUN 33* 47* 54* 58* 59* 57*  CREATININE 7.11* 8.90* 9.60* 9.86* 9.00* 7.45*  CALCIUM 7.5* 7.8* 7.7* 8.2* 8.2* 8.9  MG 1.1*  --  1.5* 2.3  --   --   PHOS 5.9*  --   --   --  7.2* 5.8*   Liver Function Tests: Recent Labs  Lab 06/20/17 0055 06/24/17 0409 06/25/17 0605  AST 20  --   --   ALT 12*  --   --   ALKPHOS 49  --   --   BILITOT 0.7  --   --   PROT 5.6*  --   --   ALBUMIN 3.0* 3.2* 3.4*   No results for input(s): LIPASE, AMYLASE in the last 168 hours. No results for input(s): AMMONIA in the last 168 hours. CBC: Recent Labs  Lab 06/20/17 0055 06/21/17 0801 06/23/17 0500  WBC 7.1 7.0 4.5  NEUTROABS 5.3 5.3  --   HGB 10.7* 11.4* 10.3*  HCT 30.7* 33.3* 30.2*  MCV 94.5 94.6 94.7  PLT 113* 130* 151   Cardiac Enzymes: No results for input(s): CKTOTAL, CKMB, CKMBINDEX, TROPONINI in the last 168 hours. BNP: BNP (last 3 results) No results for input(s): BNP in the last 8760 hours.  ProBNP (last 3 results) No results for input(s): PROBNP in the last 8760 hours.  CBG: No results for input(s): GLUCAP in the last 168 hours.     Signed:  Ramiro Harvest MD.  [facility] 06/25/2017, 9:35 AM

## 2017-06-25 NOTE — Progress Notes (Signed)
  [facility] Progress Note    Assessment/ Plan:   1. Acute kidney injury: Improving Cr, now 7.45 (BL 0.7). Secondary to suspected ATN following administration of several nephrotoxic agents (vanc/zosyn, lisinopril/HCTZ, Toradol).  Anticipate post ATN diuresis and return to baseline.  Patient will need to follow-up in clinic in approximately 2-3 weeks to recheck kidney function.  Nephrology will sign off.  2.  Left-foot osteomyelitis: Stable s/p debridement and antibiotics at [facility].  Continuing to hold abx here.  Wound c/s.  Per primary.  3.  Hypertension: BP controlled. Holding lisinopril/HCTZ.  On amlodipine and metoprolol.  4.  Hyponatremia: Now 129 today. Holding fluids. Likely due to free water excretion defect from AKI.  Will continue to fluid restrict.  5.  Hypomagnesemia: Resolved. Replete as necessary.   Subjective:   Patient feels well today. No new complaints.   Objective:   BP 131/76 (BP Location: Right Arm)   Pulse 69   Temp 98.2 F (36.8 C) (Oral)   Resp 18   Ht 6\' 3"  (1.905 m)   Wt 289 lb 8 oz (131.3 kg)   SpO2 96%   BMI 36.19 kg/m   Intake/Output Summary (Last 24 hours) at 06/25/2017 [id number] Last data filed at 06/25/2017 0700 Gross per 24 hour  Intake 1160 ml  Output 0 ml  Net 1160 ml   Weight change: -3 lb 0.8 oz (-1.384 kg)  Physical Exam: General: obese, NAD with non-toxic appearance HEENT: normocephalic, atraumatic, moist mucous membranes Cardiovascular: regular rate and rhythm without murmurs, rubs, or gallops Lungs: clear to auscultation bilaterally with normal work of breathing Abdomen: soft, non-tender, non-distended, normoactive bowel sounds Skin: warm, dry, no rashes or lesions, cap refill < 2 seconds Extremities: warm and well perfused, normal tone, no edema  Imaging: No results found.  Labs: BMET Recent Labs  Lab 06/20/17 0055 06/21/17 0801 06/22/17 0518 06/23/17 0500 06/24/17 0409 06/25/17 0605  NA 128* 131* 130*  131* 129* 132*  K 3.6 4.0 3.6 4.3 4.2 3.6  CL 98* 99* 97* 99* 98* 97*  CO2 17* 18* 17* 19* 18* 21*  GLUCOSE 92 86 95 97 104* 96  BUN 33* 47* 54* 58* 59* 57*  CREATININE 7.11* 8.90* 9.60* 9.86* 9.00* 7.45*  CALCIUM 7.5* 7.8* 7.7* 8.2* 8.2* 8.9  PHOS 5.9*  --   --   --  7.2* 5.8*   CBC Recent Labs  Lab 06/20/17 0055 06/21/17 0801 06/23/17 0500  WBC 7.1 7.0 4.5  NEUTROABS 5.3 5.3  --   HGB 10.7* 11.4* 10.3*  HCT 30.7* 33.3* 30.2*  MCV 94.5 94.6 94.7  PLT 113* 130* 151    Medications:    . amLODipine  10 mg Oral Daily  . collagenase   Topical Daily  . heparin  5,000 Units Subcutaneous Q8H  . magnesium oxide  400 mg Oral Daily  . metoprolol succinate  50 mg Oral Daily  . sodium bicarbonate  650 mg Oral TID      Durward Parcelavid McMullen, DO 06/25/2017, 9:18 AM

## 2017-06-25 NOTE — Progress Notes (Addendum)
Was brought to my attention that patient called stating he was suppose have home health service for RN at discharge.  Staff RN to have orders submitted by attending and NCM will set up services with participating home health service for patient benefit plan [employer].   NCM called [facility] [phone] for coordination assistance for patient Home Health service for RN.  Faxed face sheet, home health orders and discharge summary to [phone].  Intake UJW#[id number]#[id number]

## 2017-06-27 NOTE — Care Management Note (Signed)
Case Management Note  Patient Details  Name: Jobe MarkerJohn A Mancil MRN: [id number] Date of Birth: 03/12/1965                Action/Plan: Called patient left voicemail message requested return call today.  NCM advised we are trying to follow up with patient. Attempted to reach patient at work number but patient has not returned back to work yet.  Expected Discharge Date:  06/25/17               Expected Discharge Plan:  Home w Home Health Services Discharge planning Services  CM Consult Post Acute Care Choice:  Home Health Choice offered to:  Patient HH Arr[facility]  [facility]   [facility] Status of Service:  Completed, signed off  Additional Comments: Attempting to advise patient of Home Health Services to provide RN services.  Provider [facility]: [phone].  Spoke with [facility] [facility]: [phone], ext [phone]. states if they are unable to reach patient after 3 call attempts, case will be closed.    Yancey FlemingsKimberly R Hannahgrace Lalli, RN 06/27/2017, 2:03 PM

## 2017-07-01 ENCOUNTER — Encounter: Payer: Self-pay | Admitting: *Deleted

## 2017-07-08 ENCOUNTER — Encounter: Payer: Self-pay | Admitting: Sports Medicine

## 2017-07-08 ENCOUNTER — Encounter: Payer: Self-pay | Admitting: *Deleted

## 2017-07-08 ENCOUNTER — Ambulatory Visit (INDEPENDENT_AMBULATORY_CARE_PROVIDER_SITE_OTHER): Payer: Managed Care, Other (non HMO) | Admitting: Sports Medicine

## 2017-07-08 DIAGNOSIS — G629 Polyneuropathy, unspecified: Secondary | ICD-10-CM

## 2017-07-08 DIAGNOSIS — L97521 Non-pressure chronic ulcer of other part of left foot limited to breakdown of skin: Secondary | ICD-10-CM

## 2017-07-08 DIAGNOSIS — L03116 Cellulitis of left lower limb: Secondary | ICD-10-CM

## 2017-07-08 NOTE — Progress Notes (Signed)
  Subjective: Maxwell Reeves is a 52 y.o. male patient seen today in office for POV #1 left foot wound debridement using misonix ultrasound device while inpatient at [facility] on last month.  Patient does admit some pain to toes and that he can only wear the postop shoe.  Patient has home nursing coming to change dressings using medi honey as instructed.  Denies calf pain, denies headache, chest pain, shortness of breath, nausea, vomiting, fever, or chills. Patient states that he has follow-up with the nephrologist and urologist for his kidneys. No other issues noted.   Patient Active Problem List   Diagnosis Date Noted  . Hyponatremia   . Cellulitis of left foot   . Acute renal failure (ARF) (HCC) 06/19/2017  . Osteomyelitis of left foot (HCC) 06/19/2017  . CAD (coronary artery disease) 06/19/2017  . Essential hypertension 06/19/2017    Current Outpatient Medications on File Prior to Visit  Medication Sig Dispense Refill  . acetaminophen (TYLENOL) 325 MG tablet Take 2 tablets (650 mg total) by mouth every 6 (six) hours as needed for mild pain (or Fever >/= 101).    Marland Kitchen amLODipine (NORVASC) 10 MG tablet Take 1 tablet (10 mg total) by mouth daily. 30 tablet 0  . aspirin EC 81 MG tablet Take 1 tablet (81 mg total) by mouth daily.    . collagenase (SANTYL) ointment Apply topically daily. :Cleanse wounds to left plantar toes with NS and pat dry.  Apply Santyl to wound bed.  Cover with NS moist gauze.  COver and secure with kerlix and tape. Change daily. 90 g 0  . metoprolol succinate (TOPROL-XL) 50 MG 24 hr tablet Take 50 mg by mouth daily. Take with or immediately following a meal.    . zolpidem (AMBIEN) 10 MG tablet Take 10 mg by mouth at bedtime.      No current facility-administered medications on file prior to visit.     Allergies  Allergen Reactions  . Codeine Rash    Objective: There were no vitals filed for this visit.  General: No acute distress, AAOx3  Left foot:  Partial-thickness ulcerations noted to the plantar aspect of toes 2 through 5 that have a granular base all measuring less than 0.5 cm with no erythema no edema no acute signs of infection.  Neurovascular status unchanged from prior.  Mild tenderness to palpation to left toes.  No pain with calf compression.   Assessment and Plan:  Problem List Items Addressed This Visit      Musculoskeletal and Integument   Cellulitis of left foot    Other Visit Diagnoses    Toe ulcer, left, limited to breakdown of skin (HCC)    -  Primary   Neuropathy           -Patient seen and evaluated -Cleansed ulcerations and applied mediHoney dry sterile dressing to surgical site on left foot and stockinet  -Advised patient to make sure to keep dressings clean, dry, and intact to left surgical site, allowing home nursing to change dressings as previous using medi honey -Advised patient to continue with post-op shoe on left foot -Advised patient to limit activity to necessity  -Advised patient to ice and elevate as necessary  -Continue with no work until next office visit -Will plan for wound check and for reevaluation of return to work status at next office visit. In the meantime, patient to call office if any issues or problems arise.   Asencion Islam, DPM

## 2017-07-29 ENCOUNTER — Ambulatory Visit (INDEPENDENT_AMBULATORY_CARE_PROVIDER_SITE_OTHER): Payer: Managed Care, Other (non HMO) | Admitting: Sports Medicine

## 2017-07-29 ENCOUNTER — Encounter: Payer: Self-pay | Admitting: Sports Medicine

## 2017-07-29 ENCOUNTER — Encounter: Payer: Self-pay | Admitting: *Deleted

## 2017-07-29 DIAGNOSIS — L03116 Cellulitis of left lower limb: Secondary | ICD-10-CM

## 2017-07-29 DIAGNOSIS — G629 Polyneuropathy, unspecified: Secondary | ICD-10-CM

## 2017-07-29 DIAGNOSIS — L97521 Non-pressure chronic ulcer of other part of left foot limited to breakdown of skin: Secondary | ICD-10-CM | POA: Diagnosis not present

## 2017-07-29 NOTE — Progress Notes (Signed)
Subjective: Maxwell Reeves is a 52 y.o. male patient seen today in office for POV #2 left foot wound debridement using misonix ultrasound device while inpatient at [facility], 06/2017.  Patient does admit some pain to toes especially when he attempted to wear a normal shoe.  Patient states that he had to return back to using his postop shoe.  Denies calf pain, denies headache, chest pain, shortness of breath, nausea, vomiting, fever, or chills. Patient states that he no longer has his nurse and has been doing the wound care himself. No other issues noted.   Patient Active Problem List   Diagnosis Date Noted  . Hyponatremia   . Cellulitis of left foot   . Acute renal failure (ARF) (HCC) 06/19/2017  . Osteomyelitis of left foot (HCC) 06/19/2017  . CAD (coronary artery disease) 06/19/2017  . Essential hypertension 06/19/2017    Current Outpatient Medications on File Prior to Visit  Medication Sig Dispense Refill  . acetaminophen (TYLENOL) 325 MG tablet Take 2 tablets (650 mg total) by mouth every 6 (six) hours as needed for mild pain (or Fever >/= 101).    Marland Kitchen amLODipine (NORVASC) 10 MG tablet Take 1 tablet (10 mg total) by mouth daily. 30 tablet 0  . aspirin EC 81 MG tablet Take 1 tablet (81 mg total) by mouth daily.    . collagenase (SANTYL) ointment Apply topically daily. :Cleanse wounds to left plantar toes with NS and pat dry.  Apply Santyl to wound bed.  Cover with NS moist gauze.  COver and secure with kerlix and tape. Change daily. 90 g 0  . metoprolol succinate (TOPROL-XL) 50 MG 24 hr tablet Take 50 mg by mouth daily. Take with or immediately following a meal.    . zolpidem (AMBIEN) 10 MG tablet Take 10 mg by mouth at bedtime.      No current facility-administered medications on file prior to visit.     Allergies  Allergen Reactions  . Codeine Rash    Objective: There were no vitals filed for this visit.  General: No acute distress, AAOx3  Left foot: Partial-thickness  ulcerations noted to the plantar aspect of toes 2 through 5 that have a granular base all measuring less than 0.5 cm with almost complete healing noted on the third and fifth toes, no erythema no edema no acute signs of infection.  Neurovascular status unchanged from prior.  Mild tenderness to palpation to left toes.  No pain with calf compression.   Assessment and Plan:  Problem List Items Addressed This Visit      Musculoskeletal and Integument   Cellulitis of left foot    Other Visit Diagnoses    Toe ulcer, left, limited to breakdown of skin (HCC)    -  Primary   Neuropathy          -Patient seen and evaluated -Mechanically debrided ulcerations using tissue nipper to healthy bleeding borders to promote increased granulation tissue hemostasis was achieved with manual pressure and no need for anesthesia for this debridement.  The areas were dressed with mediHoney and dry sterile dressing to surgical site on left foot and stockinet  -Advised patient to continue with dressing changes every other day using medi honey -Advised patient to continue with post-op shoe on left foot -Advised patient to limit activity to necessity  -Advised patient to ice and elevate as necessary  -Continue with no work until next office visit, expected to be out of work at least for 1 more  month -Will plan for wound check at next office visit. In the meantime, patient to call office if any issues or problems arise.   Asencion Islam, DPM

## 2017-08-12 ENCOUNTER — Ambulatory Visit: Payer: Managed Care, Other (non HMO) | Admitting: Sports Medicine

## 2017-08-26 ENCOUNTER — Encounter: Payer: Self-pay | Admitting: *Deleted

## 2017-08-26 ENCOUNTER — Ambulatory Visit: Payer: Managed Care, Other (non HMO) | Admitting: Sports Medicine

## 2017-08-26 ENCOUNTER — Telehealth: Payer: Self-pay | Admitting: *Deleted

## 2017-08-26 NOTE — Telephone Encounter (Signed)
Ok. Thanks!

## 2017-08-26 NOTE — Telephone Encounter (Signed)
Patient arrived at office at 3pm for his 1145 appt explained that Dr Marylene LandStover was not in the office this afternoon due to surgery.  Patient requested a letter releasing him to go back to work on Monday June 24th.  I provided him with a note and rescheduled his appt.

## 2017-09-21 ENCOUNTER — Ambulatory Visit: Payer: Managed Care, Other (non HMO) | Admitting: Sports Medicine

## 2019-09-06 DEATH — deceased
